# Patient Record
Sex: Male | Born: 1985 | Race: White | Hispanic: No | Marital: Single | Smoking: Never smoker
Health system: Southern US, Community
[De-identification: ages and names within clinical notes are randomized; demographics above are authoritative.]

## PROBLEM LIST (undated history)

## (undated) DIAGNOSIS — R053 Chronic cough: Secondary | ICD-10-CM

## (undated) DIAGNOSIS — R05 Cough: Secondary | ICD-10-CM

## (undated) DIAGNOSIS — R569 Unspecified convulsions: Secondary | ICD-10-CM

## (undated) DIAGNOSIS — E109 Type 1 diabetes mellitus without complications: Secondary | ICD-10-CM

## (undated) DIAGNOSIS — E10319 Type 1 diabetes mellitus with unspecified diabetic retinopathy without macular edema: Secondary | ICD-10-CM

## (undated) HISTORY — DX: Type 1 diabetes mellitus with unspecified diabetic retinopathy without macular edema: E10.319

## (undated) HISTORY — DX: Unspecified convulsions: R56.9

## (undated) HISTORY — DX: Type 1 diabetes mellitus without complications: E10.9

## (undated) HISTORY — PX: EYE SURGERY: SHX253

## (undated) HISTORY — DX: Chronic cough: R05.3

## (undated) HISTORY — DX: Cough: R05

---

## 2015-09-19 ENCOUNTER — Encounter: Payer: Self-pay | Admitting: Emergency Medicine

## 2015-09-19 ENCOUNTER — Emergency Department (INDEPENDENT_AMBULATORY_CARE_PROVIDER_SITE_OTHER): Payer: Self-pay

## 2015-09-19 ENCOUNTER — Emergency Department
Admission: EM | Admit: 2015-09-19 | Discharge: 2015-09-19 | Disposition: A | Discharge status: Home / Self Care | Payer: Self-pay | Source: Home / Self Care

## 2015-09-19 DIAGNOSIS — J209 Acute bronchitis, unspecified: Secondary | ICD-10-CM

## 2015-09-19 DIAGNOSIS — R05 Cough: Secondary | ICD-10-CM

## 2015-09-19 MED ORDER — PREDNISONE 20 MG PO TABS: 20.0000 mg | ORAL_TABLET | Freq: Two times a day (BID) | ORAL | Status: DC

## 2015-09-19 MED ORDER — AZITHROMYCIN 250 MG PO TABS: ORAL_TABLET | ORAL | Status: DC

## 2015-09-19 NOTE — ED Notes (Signed)
Pt c/o non productive cough for the last month. States it hurts to take a deep breath. Denies fever.

## 2015-09-19 NOTE — Discharge Instructions (Signed)
Take plain guaifenesin (1200mg  extended release tabs such as Mucinex) twice daily, with plenty of water, for cough and congestion.  Get adequate rest.   May take Delsym Cough Suppressant at bedtime for nighttime cough.  Stop all antihistamines for now, and other non-prescription cough/cold preparations. Monitor glucose carefully while taking prednisone.  Follow-up with family doctor if not improving about 7 to10 days.

## 2015-09-19 NOTE — ED Provider Notes (Signed)
CSN: [id number]     Arrival date & time 09/19/15  1033 History   None    Chief Complaint  Patient presents with  . Cough      HPI Comments: Patient developed a flu-like illness with cough, chills, myalgias, and sinus congestion about 6 weeks ago.  He felt better after about two weeks.  However, about 3 weeks ago his cough began to increase.  About one week ago he developed chills, increased fatigue, and myalgias.  He now complains of persistent tightness in his anterior chest and pain with cough.  He often coughs until he gags.  The history is provided by the patient.    Past Medical History  Diagnosis Date  . Diabetes mellitus without complication (HCC)    History reviewed. No pertinent past surgical history. History reviewed. No pertinent family history. Social History  Substance Use Topics  . Smoking status: Never Smoker   . Smokeless tobacco: Never Used  . Alcohol Use: No   OB History    No data available     Review of Systems No sore throat + cough No pleuritic pain, but has pain in anterior chest No wheezing No nasal congestion No post-nasal drainage No sinus pain/pressure No itchy/red eyes No earache No hemoptysis No SOB No fever/chills No nausea No vomiting No abdominal pain No diarrhea No urinary symptoms No skin rash + fatigue + myalgias No headache Used OTC meds without relief  Allergies  Review of patient's allergies indicates not on file.  Home Medications   Prior to Admission medications   Medication Sig Start Date End Date Taking? Authorizing Provider  glucose blood test strip 1 each by Other route as needed for other. Use as instructed   Yes Historical Provider, MD  azithromycin (ZITHROMAX Z-PAK) 250 MG tablet Take 2 tabs today; then begin one tab once daily for 4 more days. 09/19/15   Lattie HawStephen A Beese, MD  predniSONE (DELTASONE) 20 MG tablet Take 1 tablet (20 mg total) by mouth 2 (two) times daily. Take with food. 09/19/15   Lattie HawStephen A Beese,  MD   Meds Ordered and Administered this Visit  Medications - No data to display  BP 113/78 mmHg  Pulse 102  Temp(Src) 98.2 F (36.8 C) (Oral)  Wt 139 lb (63.05 kg)  SpO2 98% No data found.   Physical Exam Nursing notes and Vital Signs reviewed. Appearance:  Patient appears stated age, and in no acute distress Eyes:  Pupils are equal, round, and reactive to light and accomodation.  Extraocular movement is intact.  Conjunctivae are not inflamed  Ears:  Canals normal.  Tympanic membranes normal.  Nose:  Mildly congested turbinates.  No sinus tenderness.    Pharynx:  Normal; moist mucous membranes  Neck:  Supple.  Tender slightly enlarged posterior/lateral nodes are palpated bilaterally  Lungs:  Clear to auscultation.  Breath sounds are equal.  Moving air well. Heart:  Regular rate and rhythm without murmurs, rubs, or gallops.  Rate 104 Abdomen:  Nontender without masses or hepatosplenomegaly.  Bowel sounds are present.  No CVA or flank tenderness.  Extremities:  No edema.  Skin:  No rash present.   ED Course  Procedures none  Imaging Review Dg Chest 2 View  09/19/2015  CLINICAL DATA:  Cough for 6 weeks.  Initial encounter. EXAM: CHEST  2 VIEW COMPARISON:  None. FINDINGS: The lungs are clear. Heart size is normal. There is no pneumothorax or pleural effusion. No bony abnormality. IMPRESSION: Negative chest. Electronically  Signed   By: Drusilla Kannerhomas  Dalessio M.D.   On: 09/19/2015 11:52     MDM   1. Acute bronchitis, unspecified organism; ?pertussis     Begin Z-pak for atypical coverage. Begin prednisone burst.  Take plain guaifenesin (1200mg  extended release tabs such as Mucinex) twice daily, with plenty of water, for cough and congestion.  Get adequate rest.   May take Delsym Cough Suppressant at bedtime for nighttime cough.  Stop all antihistamines for now, and other non-prescription cough/cold preparations. Monitor glucose carefully while taking prednisone.  Follow-up with family  doctor if not improving about 7 to10 days.    Lattie HawStephen A Beese, MD 09/19/15 [phone]

## 2015-10-28 ENCOUNTER — Encounter: Payer: Self-pay | Admitting: *Deleted

## 2015-10-28 ENCOUNTER — Emergency Department
Admission: EM | Admit: 2015-10-28 | Discharge: 2015-10-28 | Disposition: A | Discharge status: Home / Self Care | Payer: Self-pay | Source: Home / Self Care | Attending: Family Medicine | Admitting: Family Medicine

## 2015-10-28 DIAGNOSIS — M2550 Pain in unspecified joint: Secondary | ICD-10-CM

## 2015-10-28 DIAGNOSIS — R079 Chest pain, unspecified: Secondary | ICD-10-CM

## 2015-10-28 DIAGNOSIS — R9431 Abnormal electrocardiogram [ECG] [EKG]: Secondary | ICD-10-CM

## 2015-10-28 LAB — POCT CBC W AUTO DIFF (K'VILLE URGENT CARE)

## 2015-10-28 MED ORDER — MELOXICAM 7.5 MG PO TABS: 7.5000 mg | ORAL_TABLET | Freq: Every day | ORAL | 0 refills | Status: DC

## 2015-10-28 MED ORDER — PREDNISONE 20 MG PO TABS: ORAL_TABLET | ORAL | 0 refills | Status: DC

## 2015-10-28 NOTE — ED Provider Notes (Signed)
CSN: [id number]     Arrival date & time 10/28/15  1146 History   First MD Initiated Contact with Patient 10/28/15 1232     Chief Complaint  Patient presents with  . Joint Pain   (Consider location/radiation/quality/duration/timing/severity/associated sxs/prior Treatment) HPI  Jonathan Armstrong is a 30 y.o. male presenting to UC with c/o 5 weeks of diffuse joint pain.  He states he was seen at [facility] on 09/19/15 for chest pain and dx with acute bronchitis.  He has since completed a 5 day course of prednisone and Azithromycin.  Cough and congestion have nearly resolved, however, he is still having Right sided aching sore chest pain as well as soreness in his joints.  He has been taking Excedrin with provides mild temporary relief. He tries not to take a lot of medications however does take Exedrin when the pain becomes bad.  Pain is 4/10 at this time. Mild nausea. Denies vomiting and diarrhea. Hx of DM, he has been taking his DM medication, doing well on the medication and CBGs have been good.  No recent travel or sick contacts. No rashes. He has seen a tick on his body but states it was not attached and does not believe he has been bit.  Denies hx of CAD or family hx of heart issues. He does not currently have a PCP due to lack of insurance.    Past Medical History:  Diagnosis Date  . Diabetes mellitus without complication (HCC)    History reviewed. No pertinent surgical history. History reviewed. No pertinent family history. Social History  Substance Use Topics  . Smoking status: Never Smoker  . Smokeless tobacco: Never Used  . Alcohol use No    Review of Systems  Constitutional: Negative for chills, diaphoresis, fatigue and fever.  HENT: Negative for congestion, sore throat, trouble swallowing and voice change.   Respiratory: Negative for cough, chest tightness, shortness of breath and wheezing.   Cardiovascular: Positive for chest pain (Right side). Negative for palpitations.   Gastrointestinal: Positive for nausea. Negative for abdominal pain, diarrhea and vomiting.  Musculoskeletal: Positive for arthralgias and myalgias. Negative for back pain.  Skin: Negative for color change and rash.  Neurological: Negative for dizziness, syncope, weakness, light-headedness, numbness and headaches.    Allergies  Review of patient's allergies indicates no known allergies.  Home Medications   Prior to Admission medications   Medication Sig Start Date End Date Taking? Authorizing Provider  glucose blood test strip 1 each by Other route as needed for other. Use as instructed    Historical Provider, MD  meloxicam (MOBIC) 7.5 MG tablet Take 1-2 tablets (7.5-15 mg total) by mouth daily. 10/28/15   Junius Finner, PA-C  predniSONE (DELTASONE) 20 MG tablet 3 tabs po daily x 3 days, then 2 tabs x 3 days, then 1.5 tabs x 3 days, then 1 tab x 3 days, then 0.5 tabs x 3 days 10/28/15   Junius Finner, PA-C   Meds Ordered and Administered this Visit  Medications - No data to display  BP 105/74 (BP Location: Left Arm)   Pulse 107   Temp 98.3 F (36.8 C) (Oral)   Resp 16   Ht 5\' 9"  (1.753 m)   Wt 137 lb (62.1 kg)   SpO2 99%   BMI 20.23 kg/m  No data found.   Physical Exam  Constitutional: He appears well-developed and well-nourished.  HENT:  Head: Normocephalic and atraumatic.  Eyes: Conjunctivae are normal. No scleral icterus.  Neck: Normal range  of motion.  Cardiovascular: Normal rate, regular rhythm and normal heart sounds.   Pulmonary/Chest: Effort normal and breath sounds normal. No respiratory distress. He has no wheezes. He has no rales. He exhibits tenderness ( Right side).  No respiratory distress. Lungs: CTAB. Mild tenderness to Right upper anterior chest, no crepitus. Able to speak in full sentences w/o difficulty.   Abdominal: Soft. He exhibits no distension. There is no tenderness.  Musculoskeletal: Normal range of motion.  Neurological: He is alert.  Skin: Skin is  warm and dry.  Nursing note and vitals reviewed.   Urgent Care Course   Clinical Course    Procedures (including critical care time)  Labs Review Labs Reviewed  COMPLETE METABOLIC PANEL WITH GFR  B. BURGDORFI ANTIBODIES  TSH  SEDIMENTATION RATE  C-REACTIVE PROTEIN  ROCKY MTN SPOTTED FVR ABS PNL(IGG+IGM)  POCT CBC W AUTO DIFF ([facility])    Imaging Review No results found.  Date/Time: 10/28/15    12:41:54 Ventricular Rate: 94 PR Interval: 132 QRS Duration: 88 QT Interval: 346 QTC Calculation: 405 P-QRS-T: 72/82/90 Text Interpretation: Sinus rhythm, Left atrial enlargement, nonspecific T-abnormality. Abnormal.   No prior EKG to compare.    MDM   1. Arthralgia   2. Chest pain, unspecified chest pain type   3. Abnormal EKG    Pt c/o 5 weeks of diffuse joint pain w/o swelling, rashes or fever. Pt does have mild nausea and persistent Right side chest pain that is reproducible with palpation.  Pt quested further evaluation of his heart despite prior hx of heart disease or family hx of cardiac issues. EKG abnormal, suggestive of enlarged atria.  CP atypical for ACS.  Recommended f/u outpatient cardiology for further evaluation if CP persists. Advised to call 911 or go to closest emergency department if symptoms of chest pain or SOB worsen.  Labs: CBC- unremarkable.  Labs pendings: CMP, TSH, CRP, Sed Rate, Lymes, and RMSF.  Rx: Prednisone 2 week taper and meloxicam Encouraged f/u with Dr. Denyse Amass, Primary Care/Sports Medicine, in 1 week if not improving and for ongoing healthcare needs. Patient verbalized understanding and agreement with treatment plan.      Junius Finner, PA-C 10/28/15 1310

## 2015-10-28 NOTE — Discharge Instructions (Signed)
°  You have been prescribed 2 weeks of prednisone, an oral steroid to help with inflammation and itching.  You may start this medication tomorrow with breakfast as it can make it difficult to sleep if taken at night.  Meloxicam (Mobic) is an antiinflammatory to help with pain and inflammation.  Do not take ibuprofen, Advil, Aleve, or any other medications that contain NSAIDs while taking meloxicam as this may cause stomach upset or even ulcers if taken in large amounts for an extended period of time.

## 2015-10-28 NOTE — ED Triage Notes (Signed)
Pt c/o joint pain x 5 wks. He reports being seen for acute bronchitis, CP and joint pain on 09/18/25. The Bronchitis has resolved, but he has continued joint pain. Excedrin helps minimally.

## 2015-10-29 LAB — COMPLETE METABOLIC PANEL WITH GFR
ALT: 31 U/L (ref 9–46)
AST: 22 U/L (ref 10–40)
Albumin: 4.3 g/dL (ref 3.6–5.1)
Alkaline Phosphatase: 60 U/L (ref 40–115)
BUN: 15 mg/dL (ref 7–25)
CO2: 26 mmol/L (ref 20–31)
Calcium: 9.5 mg/dL (ref 8.6–10.3)
Chloride: 98 mmol/L (ref 98–110)
Creat: 0.89 mg/dL (ref 0.60–1.35)
GFR, Est African American: >89 mL/min (ref >=60)
GFR, Est Non African American: >89 mL/min (ref >=60)
Glucose, Bld: 342 mg/dL — ABNORMAL HIGH (ref 65–99)
Potassium: 4.9 mmol/L (ref 3.5–5.3)
Sodium: 138 mmol/L (ref 135–146)
Total Bilirubin: 0.6 mg/dL (ref 0.2–1.2)
Total Protein: 7.2 g/dL (ref 6.1–8.1)

## 2015-10-29 LAB — SEDIMENTATION RATE: Sed Rate: 5 mm/hr (ref 0–15)

## 2015-10-29 LAB — TSH: TSH: 1.09 mIU/L (ref 0.40–4.50)

## 2015-10-29 LAB — C-REACTIVE PROTEIN: CRP: <0.5 mg/dL (ref <0.60)

## 2015-10-29 LAB — LYME AB/WESTERN BLOT REFLEX: B burgdorferi Ab IgG+IgM: <0.9 Index (ref <0.90)

## 2015-10-31 ENCOUNTER — Telehealth: Payer: Self-pay | Admitting: Emergency Medicine

## 2015-10-31 LAB — ROCKY MTN SPOTTED FVR ABS PNL(IGG+IGM)
RMSF IgG: NOT DETECTED
RMSF IgM: NOT DETECTED

## 2015-10-31 NOTE — Telephone Encounter (Signed)
All labs normal except glucose 342, call if you have any questions, hope you are feeling better

## 2015-11-28 ENCOUNTER — Encounter: Payer: Self-pay | Admitting: Emergency Medicine

## 2015-11-28 ENCOUNTER — Emergency Department (INDEPENDENT_AMBULATORY_CARE_PROVIDER_SITE_OTHER): Payer: Self-pay

## 2015-11-28 ENCOUNTER — Emergency Department (INDEPENDENT_AMBULATORY_CARE_PROVIDER_SITE_OTHER)
Admission: EM | Admit: 2015-11-28 | Discharge: 2015-11-28 | Disposition: A | Discharge status: Home / Self Care | Payer: Self-pay | Source: Home / Self Care | Attending: Family Medicine | Admitting: Family Medicine

## 2015-11-28 DIAGNOSIS — S134XXA Sprain of ligaments of cervical spine, initial encounter: Secondary | ICD-10-CM

## 2015-11-28 DIAGNOSIS — M542 Cervicalgia: Secondary | ICD-10-CM

## 2015-11-28 DIAGNOSIS — S139XXA Sprain of joints and ligaments of unspecified parts of neck, initial encounter: Secondary | ICD-10-CM

## 2015-11-28 MED ORDER — MELOXICAM 7.5 MG PO TABS: 7.5000 mg | ORAL_TABLET | Freq: Every day | ORAL | 0 refills | Status: DC
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
## 2015-11-28 NOTE — Discharge Instructions (Signed)
Apply ice pack for 20 to 30 minutes, 3 to 4 times daily  Continue until pain decreases.  °Begin range of motion and stretching exercises as tolerated. °
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
## 2015-11-30 ENCOUNTER — Emergency Department (INDEPENDENT_AMBULATORY_CARE_PROVIDER_SITE_OTHER)
Admission: EM | Admit: 2015-11-30 | Discharge: 2015-11-30 | Disposition: A | Discharge status: Home / Self Care | Payer: Self-pay | Source: Home / Self Care | Attending: Family Medicine | Admitting: Family Medicine

## 2015-11-30 ENCOUNTER — Encounter: Payer: Self-pay | Admitting: Emergency Medicine

## 2015-11-30 ENCOUNTER — Emergency Department (INDEPENDENT_AMBULATORY_CARE_PROVIDER_SITE_OTHER): Payer: Self-pay

## 2015-11-30 DIAGNOSIS — R0781 Pleurodynia: Secondary | ICD-10-CM

## 2015-11-30 DIAGNOSIS — S20212A Contusion of left front wall of thorax, initial encounter: Secondary | ICD-10-CM

## 2015-11-30 MED ORDER — HYDROCODONE-ACETAMINOPHEN 5-325 MG PO TABS: ORAL_TABLET | ORAL | 0 refills | Status: DC
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
## 2015-11-30 NOTE — Discharge Instructions (Signed)
Wear rib belt until improved.  Continue to apply ice pack 2 or 3 times daily.  Continue Mobic
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
## 2016-01-13 ENCOUNTER — Ambulatory Visit (INDEPENDENT_AMBULATORY_CARE_PROVIDER_SITE_OTHER): Payer: Self-pay | Admitting: Sports Medicine

## 2016-01-13 ENCOUNTER — Encounter: Payer: Self-pay | Admitting: Sports Medicine

## 2016-01-13 ENCOUNTER — Ambulatory Visit (INDEPENDENT_AMBULATORY_CARE_PROVIDER_SITE_OTHER): Payer: Self-pay

## 2016-01-13 DIAGNOSIS — R0789 Other chest pain: Secondary | ICD-10-CM

## 2016-01-13 DIAGNOSIS — R911 Solitary pulmonary nodule: Secondary | ICD-10-CM

## 2016-01-13 MED ORDER — DICLOFENAC SODIUM 75 MG PO TBEC: 75.0000 mg | DELAYED_RELEASE_TABLET | Freq: Two times a day (BID) | ORAL | 3 refills | Status: DC

## 2016-01-13 NOTE — Assessment & Plan Note (Signed)
Persistent for greater than 6 weeks despite conservative measures. X-rays were negative. Pain is along the costal margin. We are going to get a CT of the chest without contrast for further the limitation of his anatomy. Discontinue meloxicam and switching to Voltaren.
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
## 2016-04-02 ENCOUNTER — Ambulatory Visit (INDEPENDENT_AMBULATORY_CARE_PROVIDER_SITE_OTHER): Payer: Self-pay | Admitting: Physician Assistant

## 2016-04-02 ENCOUNTER — Ambulatory Visit (INDEPENDENT_AMBULATORY_CARE_PROVIDER_SITE_OTHER): Payer: Self-pay

## 2016-04-02 ENCOUNTER — Encounter: Payer: Self-pay | Admitting: Physician Assistant

## 2016-04-02 VITALS — BP 123/82 | HR 99 | Wt 144.0 lb

## 2016-04-02 DIAGNOSIS — Z23 Encounter for immunization: Secondary | ICD-10-CM

## 2016-04-02 DIAGNOSIS — R05 Cough: Secondary | ICD-10-CM

## 2016-04-02 DIAGNOSIS — E1042 Type 1 diabetes mellitus with diabetic polyneuropathy: Secondary | ICD-10-CM | POA: Insufficient documentation

## 2016-04-02 DIAGNOSIS — IMO0002 Reserved for concepts with insufficient information to code with codable children: Secondary | ICD-10-CM | POA: Insufficient documentation

## 2016-04-02 DIAGNOSIS — E1065 Type 1 diabetes mellitus with hyperglycemia: Secondary | ICD-10-CM

## 2016-04-02 DIAGNOSIS — E108 Type 1 diabetes mellitus with unspecified complications: Secondary | ICD-10-CM

## 2016-04-02 DIAGNOSIS — R053 Chronic cough: Secondary | ICD-10-CM | POA: Insufficient documentation

## 2016-04-02 LAB — POCT GLYCOSYLATED HEMOGLOBIN (HGB A1C): HEMOGLOBIN A1C: 8.1

## 2016-04-02 MED ORDER — ALBUTEROL SULFATE HFA 108 (90 BASE) MCG/ACT IN AERS
1.0000 | INHALATION_SPRAY | RESPIRATORY_TRACT | 0 refills | Status: DC | PRN
Start: 2016-04-02 — End: 2017-02-10

## 2016-04-02 MED ORDER — GABAPENTIN 100 MG PO CAPS: 100.0000 mg | ORAL_CAPSULE | Freq: Every day | ORAL | 11 refills | Status: DC

## 2016-04-02 NOTE — Progress Notes (Signed)
HPI:                                                                Jonathan Armstrong is a 31 y.o. male who presents to [facility]: [facility] Medicine today to establish care   He is accompanied by his mother, who provides most of the history. Mother states patient was living in [place], but was unable to take care of himself. He is currently unemployed and living with her.  DM Type I: diagnosed age 49. Checks BS in the am and pm, sometimes lunch. Sliding scale of Novolin, 60 total units per day. Morning sugars are high, 250 on average. No recent hospitalizations. Does have retinopathy for which he is seeing ophthalmology. Also endorses numbness in his toes. He is checking his feet.  Cough: this is a chronic problem for him. worsening cough x 2 months. Cough is rarely productive of mucus. Coughs so hard it makes him regurgitate food. Nonsmoker. No history of asthma or COPD.   Health Maintenance Health Maintenance  Topic Date Due  . HEMOGLOBIN A1C  1983/08/15  . PNEUMOCOCCAL POLYSACCHARIDE VACCINE (1) 03/10/1986  . FOOT EXAM  03/10/1994  . OPHTHALMOLOGY EXAM  03/10/1994  . HIV Screening  03/11/1999  . TETANUS/TDAP  03/11/2003  . INFLUENZA VACCINE  10/28/2015    Past Medical History:  Diagnosis Date  . Diabetes mellitus without complication (HCC)    No past surgical history on file. Social History  Substance Use Topics  . Smoking status: Never Smoker  . Smokeless tobacco: Never Used  . Alcohol use No   family history is not on file.  Review of Systems  Constitutional: Negative for chills, fever, malaise/fatigue and weight loss.  HENT: Negative.   Eyes: Positive for blurred vision (wears corrective lenses).  Respiratory: Positive for cough. Negative for hemoptysis, sputum production and shortness of breath.   Cardiovascular: Negative.   Gastrointestinal: Negative.   Genitourinary: Negative.   Musculoskeletal: Positive for back pain.  Skin:  Negative.   Neurological: Positive for tingling (b/l feet) and sensory change (b/l feet). Negative for focal weakness.     Medications: Current Outpatient Prescriptions  Medication Sig Dispense Refill  . albuterol (PROVENTIL HFA;VENTOLIN HFA) 108 (90 Base) MCG/ACT inhaler Inhale 1 puff into the lungs every 4 (four) hours as needed for wheezing. 1 Inhaler 0  . gabapentin (NEURONTIN) 100 MG capsule Take 1 capsule (100 mg total) by mouth at bedtime. 60 capsule 11  . glucose blood test strip 1 each by Other route as needed for other. Use as instructed     No current facility-administered medications for this visit.    No Known Allergies     Objective:  BP 123/82   Pulse 99   Wt 144 lb (65.3 kg)   BMI 21.27 kg/m  Gen: well-groomed, cooperative, not ill-appearing, no distress HEENT: normal conjunctiva, TM's clear, oropharynx clear, moist mucus membranes, no thyromegaly or tenderness Lungs: Normal work of breathing, clear to auscultation bilaterally Heart: Normal rate, regular rhythm, s1 and s2 distinct, no murmurs, clicks or rubs appreciated on this exam, no carotid bruit Abd: Soft. Nondistended, Nontender Neuro: alert and oriented x 3, EOM's intact, PERRLA, DTR's intact MSK: strength 5/5 and symmetric, normal gait Extremities: distal pulses intact,  no peripheral edema Skin: warm and dry, no rashes or lesions on exposed skin Psych: flat affect, pleasant mood, slowed speech  Diabetic Foot Exam - Simple   Simple Foot Form Diabetic Foot exam was performed with the following findings:  Yes 04/02/2016  4:21 PM  Visual Inspection No deformities, no ulcerations, no other skin breakdown bilaterally:  Yes Sensation Testing Pulse Check Comments No sensation in left foot with monofilament testing Sensory deficit in the right great toe with monofilament     Assessment and Plan: 31 y.o. male with   Patient declined labs due to cost and uninsured status  Chronic cough - DG Chest 2  View - albuterol (PROVENTIL HFA;VENTOLIN HFA) 108 (90 Base) MCG/ACT inhaler; Inhale 1 puff into the lungs every 4 (four) hours as needed for wheezing.  Dispense: 1 Inhaler; Refill: 0  Type 1 diabetes mellitus with complication, uncontrolled (HCC) - A1C 8.1 today - Pneumovax and influenza vaccines given today - referred to Endocrinology. I told [facility] to [facility] to see if they had any financial assistance   Diabetic polyneuropathy [facility](HCC) - follow-up with Ophthalmology for retinopathy - Ambulatory referral to Endocrinology - gabapentin (NEURONTIN) 100 MG capsule; Take 1 capsule (100 mg total) by mouth at bedtime.  Dispense: 60 capsule; Refill: 11  Patient education and anticipatory guidance given Patient agrees with treatment plan Follow-up in 6 months or sooner as needed  Levonne Hubertharley E. Jazleen Robeck PA-C

## 2016-04-02 NOTE — Patient Instructions (Signed)
I have ordered a chest xray. Please go downstairs and complete that today. We will contact you with the results I have sent a prescription to your pharmacy for an inhaler for bronchospasm/cough I have sent a referral to Endocrinology. I believe [facility], so I recommend you contact them about that.
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
## 2017-02-10 ENCOUNTER — Ambulatory Visit (INDEPENDENT_AMBULATORY_CARE_PROVIDER_SITE_OTHER): Payer: Self-pay | Admitting: Physician Assistant

## 2017-02-10 VITALS — BP 122/76 | HR 87

## 2017-02-10 DIAGNOSIS — Z1331 Encounter for screening for depression: Secondary | ICD-10-CM

## 2017-02-10 DIAGNOSIS — Z23 Encounter for immunization: Secondary | ICD-10-CM

## 2017-02-10 NOTE — Progress Notes (Signed)
Spoke with PCP regarding positive depression screen, OV appt made.

## 2017-02-14 ENCOUNTER — Ambulatory Visit (INDEPENDENT_AMBULATORY_CARE_PROVIDER_SITE_OTHER): Payer: Self-pay

## 2017-02-14 ENCOUNTER — Encounter: Payer: Self-pay | Admitting: Physician Assistant

## 2017-02-14 ENCOUNTER — Ambulatory Visit (INDEPENDENT_AMBULATORY_CARE_PROVIDER_SITE_OTHER): Payer: Self-pay | Admitting: Physician Assistant

## 2017-02-14 VITALS — BP 138/90 | HR 90 | Temp 97.9°F | Wt 151.0 lb

## 2017-02-14 DIAGNOSIS — E10319 Type 1 diabetes mellitus with unspecified diabetic retinopathy without macular edema: Secondary | ICD-10-CM

## 2017-02-14 DIAGNOSIS — R05 Cough: Secondary | ICD-10-CM

## 2017-02-14 DIAGNOSIS — R053 Chronic cough: Secondary | ICD-10-CM

## 2017-02-14 DIAGNOSIS — Z87898 Personal history of other specified conditions: Secondary | ICD-10-CM

## 2017-02-14 DIAGNOSIS — E1065 Type 1 diabetes mellitus with hyperglycemia: Secondary | ICD-10-CM

## 2017-02-14 DIAGNOSIS — E108 Type 1 diabetes mellitus with unspecified complications: Secondary | ICD-10-CM

## 2017-02-14 DIAGNOSIS — IMO0002 Reserved for concepts with insufficient information to code with codable children: Secondary | ICD-10-CM

## 2017-02-14 HISTORY — DX: Type 1 diabetes mellitus with unspecified diabetic retinopathy without macular edema: E10.319

## 2017-02-14 LAB — POCT GLYCOSYLATED HEMOGLOBIN (HGB A1C): HEMOGLOBIN A1C: 8

## 2017-02-14 LAB — POCT UA - MICROALBUMIN
Albumin/Creatinine Ratio, Urine, POC: <30
CREATININE, POC: 200 mg/dL
Microalbumin Ur, POC: 10 mg/L

## 2017-02-14 MED ORDER — OMEPRAZOLE 20 MG PO CPDR: 20.0000 mg | DELAYED_RELEASE_CAPSULE | Freq: Every day | ORAL | 11 refills | Status: DC

## 2017-02-14 MED ORDER — MONTELUKAST SODIUM 10 MG PO TABS: 10.0000 mg | ORAL_TABLET | Freq: Every day | ORAL | 3 refills | Status: DC

## 2017-02-14 MED ORDER — CETIRIZINE HCL 10 MG PO TABS: 10.0000 mg | ORAL_TABLET | Freq: Every day | ORAL | 11 refills | Status: DC

## 2017-02-14 NOTE — Progress Notes (Signed)
HPI:                                                                Jonathan Armstrong is a 31 y.o. male who [facility] today for cough / diabetes follow-up  Patient with uncontrolled Type I DM  Patient reports persistent non-productive cough for 1.5 years. Reports cough is aggravated by eating. Endorses nightsweats and dyspnea on exertion. Denies fever, chills, weight loss, wheezing, hemoptysis. Denies occupational exposure or smoking history. No international travel. No sick contacts. Has tried Albuterol inhaler, OTC syrups and cough drops He had a CT chest in October 2017, which showed a 4 mm ground glass nodule in the LUL that did not require follow-up imaging. Chest x-ray in January 2018 was unremarkable.  DMII: patient was referred to endocrinology, but never established care. He is managing his own Insulin by [facility]ials [facility]. Reports he injects 15 units of Novolin N twice a day and approximately 15 units of R at mealtime. Compliant with medications. Last A1C 8.1 (10 months ago). Checks blood sugars at home. Blood sugar range 120-185. Denies polyuria, vision change, and paresthesias. Denies ulcers/wounds on feet. Reports a severe hypoglycemic event in August 2018 where he had a seizure. Reports he did not seek medical attention because he does not have health insurance.  Eye exam: UTD Foot exam: UTD Diet: reports eating about 70g of carbohydrates per meal   Past Medical History:  Diagnosis Date  . Diabetes mellitus without complication (HCC)   . Diabetic retinopathy associated with type 1 diabetes mellitus (HCC) 02/14/2017  . Seizure [facility](HCC)    Past Surgical History:  Procedure Laterality Date  . EYE SURGERY     Social History   Tobacco Use  . Smoking status: Never Smoker  . Smokeless tobacco: Never Used  Substance Use Topics  . Alcohol use: No   family history is  not on file.  ROS: negative except as noted in the HPI  Medications: Current Outpatient Medications  Medication Sig Dispense Refill  . glucose blood test strip 1 each by Other route as needed for other. Use as instructed    . insulin NPH Human (HUMULIN N,NOVOLIN N) 100 UNIT/ML injection Inject 15 Units 2 (two) times daily into the skin.    Marland Kitchen. insulin regular (NOVOLIN R,HUMULIN R) 100 units/mL injection Inject 15 Units 2 (two) times daily before a meal into the skin.     . cetirizine (ZYRTEC) 10 MG tablet Take 1 tablet (10 mg total) daily by mouth. 30 tablet 11  . montelukast (SINGULAIR) 10 MG tablet Take 1 tablet (10 mg total) at bedtime by mouth. 30 tablet 3  . omeprazole (PRILOSEC) 20 MG capsule Take 1 capsule (20 mg total) daily by mouth. 30 capsule 11   No current facility-administered medications for this visit.    No Known Allergies     Objective:  BP 138/90   Pulse 90   Temp 97.9 F (36.6 C)   Wt 151 lb (68.5 kg)   SpO2 99%   BMI 22.30 kg/m  Gen:  alert, not ill-appearing, no distress, appropriate for age HEENT: head normocephalic without obvious abnormality, conjunctiva and cornea clear, wearing  glasses, oropharynx clear, neck supple, no adenopathy, trachea midline Pulm: Normal work of breathing, normal phonation, clear to auscultation bilaterally, no wheezes, rales or rhonchi CV: Normal rate, regular rhythm, s1 and s2 distinct, no murmurs, clicks or rubs  GI: abdomen normal appearing, soft, nontender, nondistended, negative Murphy's sign, no palpable masses, no organomegaly Neuro: alert and oriented x 3, no tremor MSK: extremities atraumatic, normal gait and station Skin: intact, no rashes on exposed skin, no jaundice, no cyanosis  Depression screen PHQ 2/9 02/10/2017  Decreased Interest 2  Down, Depressed, Hopeless 1  PHQ - 2 Score 3  Altered sleeping 3  Tired, decreased energy 2  Change in appetite 0  Feeling bad or failure about yourself  1  Trouble  concentrating 2  Moving slowly or fidgety/restless 0  Suicidal thoughts 0  PHQ-9 Score 11     No results found for this or any previous visit (from the past 72 hour(s)). No results found.    Assessment and Plan: 31 y.o. male with   1. Persistent cough - personally reviewed Chest x-ray today, which was negative for cardiopulmonary disease. Recommend patient undergo follow-up spirometry. Differential includes upper airway cough syndrome versus GERD versus asthma/pulmonary disease. Trial of nightly antihistamine, leukotriene antagonist, and PPI for 4 weeks. - DG Chest 2 View; Future - omeprazole (PRILOSEC) 20 MG capsule; Take 1 capsule (20 mg total) daily by mouth.  Dispense: 30 capsule; Refill: 11 - cetirizine (ZYRTEC) 10 MG tablet; Take 1 tablet (10 mg total) daily by mouth.  Dispense: 30 tablet; Refill: 11 - montelukast (SINGULAIR) 10 MG tablet; Take 1 tablet (10 mg total) at bedtime by mouth.  Dispense: 30 tablet; Refill: 3  2. Type 1 diabetes mellitus with complication, uncontrolled (HCC) - POCT HgB A1C 8.0 today, not at goal - POCT UA - Microalbumin normal - recommended patient titrate his basal insulin by 2 units every 3 days until at morning FBG goal of 80-130. Also counseled to reduce carbohydrate intake at meals - follow-up in 3 months - CBC with Differential/Platelet - Comprehensive metabolic panel - Lipid Panel w/reflex Direct LDL   3. 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 Cummings PA-C

## 2017-02-14 NOTE — Patient Instructions (Addendum)
I have also ordered fasting labs. The lab is a walk-in open M-F 7:30a-4:30p (closed 12:30-1:30p). Nothing to eat or drink after midnight or at least 8 hours before your blood draw. You can have water and your medications.   For your diabetes: - continue 15 units of basal insulin twice a day - check your morning fasting blood sugar daily (goal 80 - 130 fasting) - increase your basal insulin by 2 units every 3 days until you are at goal - try to keep your carbohydrate intake consistent at mealtimes - 30 g of carbs for meals, 15 g of carbs for snacks. Use 1 unit of insulin per 8 grams of carbs (4 units per 30g meal) - follow-up in 3 months    Carbohydrate Counting for Diabetes Mellitus, Adult Carbohydrate counting is a method for keeping track of how many carbohydrates you eat. Eating carbohydrates naturally increases the amount of sugar (glucose) in the blood. Counting how many carbohydrates you eat helps keep your blood glucose within normal limits, which helps you manage your diabetes (diabetes mellitus). It is important to know how many carbohydrates you can safely have in each meal. This is different for every person. A diet and nutrition specialist (registered dietitian) can help you make a meal plan and calculate how many carbohydrates you should have at each meal and snack. Carbohydrates are found in the following foods:  Grains, such as breads and cereals.  Dried beans and soy products.  Starchy vegetables, such as potatoes, peas, and corn.  Fruit and fruit juices.  Milk and yogurt.  Sweets and snack foods, such as cake, cookies, candy, chips, and soft drinks.  How do I count carbohydrates? There are two ways to count carbohydrates in food. You can use either of the methods or a combination of both. Reading "Nutrition Facts" on packaged food The "Nutrition Facts" list is included on the labels of almost all packaged foods and beverages in the [place] It includes:  The serving  size.  Information about nutrients in each serving, including the grams (g) of carbohydrate per serving.  To use the "Nutrition Facts":  Decide how many servings you will have.  Multiply the number of servings by the number of carbohydrates per serving.  The resulting number is the total amount of carbohydrates that you will be having.  Learning standard serving sizes of other foods When you eat foods containing carbohydrates that are not packaged or do not include "Nutrition Facts" on the label, you need to measure the servings in order to count the amount of carbohydrates:  Measure the foods that you will eat with a food scale or measuring cup, if needed.  Decide how many standard-size servings you will eat.  Multiply the number of servings by 15. Most carbohydrate-rich foods have about 15 g of carbohydrates per serving. ? For example, if you eat 8 oz (170 g) of strawberries, you will have eaten 2 servings and 30 g of carbohydrates (2 servings x 15 g = 30 g).  For foods that have more than one food mixed, such as soups and casseroles, you must count the carbohydrates in each food that is included.  The following list contains standard serving sizes of common carbohydrate-rich foods. Each of these servings has about 15 g of carbohydrates:   hamburger bun or  English muffin.   oz (15 mL) syrup.   oz (14 g) jelly.  1 slice of bread.  1 six-inch tortilla.  3 oz (85 g) cooked rice  or pasta.  4 oz (113 g) cooked dried beans.  4 oz (113 g) starchy vegetable, such as peas, corn, or potatoes.  4 oz (113 g) hot cereal.  4 oz (113 g) mashed potatoes or  of a large baked potato.  4 oz (113 g) canned or frozen fruit.  4 oz (120 mL) fruit juice.  4-6 crackers.  6 chicken nuggets.  6 oz (170 g) unsweetened dry cereal.  6 oz (170 g) plain fat-free yogurt or yogurt sweetened with artificial sweeteners.  8 oz (240 mL) milk.  8 oz (170 g) fresh fruit or one small piece  of fruit.  24 oz (680 g) popped popcorn.  Example of carbohydrate counting Sample meal  3 oz (85 g) chicken breast.  6 oz (170 g) brown rice.  4 oz (113 g) corn.  8 oz (240 mL) milk.  8 oz (170 g) strawberries with sugar-free whipped topping. Carbohydrate calculation 1. Identify the foods that contain carbohydrates: ? Rice. ? Corn. ? Milk. ? Strawberries. 2. Calculate how many servings you have of each food: ? 2 servings rice. ? 1 serving corn. ? 1 serving milk. ? 1 serving strawberries. 3. Multiply each number of servings by 15 g: ? 2 servings rice x 15 g = 30 g. ? 1 serving corn x 15 g = 15 g. ? 1 serving milk x 15 g = 15 g. ? 1 serving strawberries x 15 g = 15 g. 4. Add together all of the amounts to find the total grams of carbohydrates eaten: ? 30 g + 15 g + 15 g + 15 g = 75 g of carbohydrates total. This information is not intended to replace advice given to you by your health care provider. Make sure you discuss any questions you have with your health care provider. Document Released: 03/15/2005 Document Revised: 10/03/2015 Document Reviewed: 08/27/2015 Elsevier Interactive Patient Education  [employer]2018 Elsevier Inc.

## 2017-02-19 ENCOUNTER — Encounter: Payer: Self-pay | Admitting: Physician Assistant

## 2017-02-19 DIAGNOSIS — Z87898 Personal history of other specified conditions: Secondary | ICD-10-CM | POA: Insufficient documentation

## 2017-04-11 ENCOUNTER — Ambulatory Visit (INDEPENDENT_AMBULATORY_CARE_PROVIDER_SITE_OTHER): Payer: Self-pay | Admitting: Physician Assistant

## 2017-04-11 VITALS — BP 131/77 | HR 96 | Temp 98.6°F | Resp 16 | Ht 69.0 in | Wt 149.0 lb

## 2017-04-11 DIAGNOSIS — R058 Other specified cough: Secondary | ICD-10-CM | POA: Insufficient documentation

## 2017-04-11 DIAGNOSIS — R05 Cough: Secondary | ICD-10-CM

## 2017-04-11 DIAGNOSIS — R053 Chronic cough: Secondary | ICD-10-CM

## 2017-04-11 MED ORDER — ALBUTEROL SULFATE (2.5 MG/3ML) 0.083% IN NEBU
2.5000 mg | INHALATION_SOLUTION | Freq: Once | RESPIRATORY_TRACT | Status: AC
  Administered 2017-04-11: 2.5 mg via RESPIRATORY_TRACT

## 2017-04-11 MED ORDER — FLUTICASONE PROPIONATE 50 MCG/ACT NA SUSP: 1.0000 | Freq: Every day | NASAL | 1 refills | Status: AC

## 2017-04-11 MED ORDER — OMEPRAZOLE 40 MG PO CPDR: 40.0000 mg | DELAYED_RELEASE_CAPSULE | Freq: Every day | ORAL | 0 refills | Status: DC

## 2017-04-11 MED ORDER — ALBUTEROL SULFATE (2.5 MG/3ML) 0.083% IN NEBU: 2.5000 mg | INHALATION_SOLUTION | Freq: Four times a day (QID) | RESPIRATORY_TRACT | 1 refills | Status: DC | PRN

## 2017-04-11 NOTE — Progress Notes (Signed)
HPI:                                                                Jonathan Armstrong is a 32 y.o. male who presents to [facility]: Primary Care Sports Medicine today for spirometry / evaluation of chronic cough  Pleasant 32 yo M with PMH of type 1 diabetes and chronic cough presents today for spirometry. Patient has reported a non-productive persistent cough for 1.5 years. Cough is worse with exertion, lying flat, and at night. Endorses occasional rhinorrhea/nasal congestion. Denies constitutional symptoms, dyspnea, wheezing. Denies dysphagia, odynophagia, hoarseness. No history of asthma or tobacco use. States beta agonist inhaler did not help at all. Has been taking Zyrtec, Montelukast and Omeprazole without much improvement. Using cough drops daily.  Past Medical History:  Diagnosis Date  . Chronic cough   . Diabetes mellitus without complication (HCC)   . Diabetic retinopathy associated with type 1 diabetes mellitus (HCC) 02/14/2017  . Seizure [facility])    Past Surgical History:  Procedure Laterality Date  . EYE SURGERY     Social History   Tobacco Use  . Smoking status: Never Smoker  . Smokeless tobacco: Never Used  Substance Use Topics  . Alcohol use: No   family history is not on file.  Depression screen PHQ 2/9 02/10/2017  Decreased Interest 2  Down, Depressed, Hopeless 1  PHQ - 2 Score 3  Altered sleeping 3  Tired, decreased energy 2  Change in appetite 0  Feeling bad or failure about yourself  1  Trouble concentrating 2  Moving slowly or fidgety/restless 0  Suicidal thoughts 0  PHQ-9 Score 11    No flowsheet data found.    ROS: negative except as noted in the HPI  Medications: Current Outpatient Medications  Medication Sig Dispense Refill  . albuterol (PROVENTIL) (2.5 MG/3ML) 0.083% nebulizer solution Take 3 mLs (2.5 mg total) by nebulization every 6 (six) hours as needed for wheezing or shortness of breath. 150 mL 1  . cetirizine (ZYRTEC)  10 MG tablet Take 1 tablet (10 mg total) daily by mouth. 30 tablet 11  . fluticasone (FLONASE) 50 MCG/ACT nasal spray Place 1 spray into both nostrils at bedtime. 16 g 1  . glucose blood test strip 1 each by Other route as needed for other. Use as instructed    . insulin NPH Human (HUMULIN N,NOVOLIN N) 100 UNIT/ML injection Inject 15 Units 2 (two) times daily into the skin.    Marland Kitchen insulin regular (NOVOLIN R,HUMULIN R) 100 units/mL injection Inject 15 Units 2 (two) times daily before a meal into the skin.     . montelukast (SINGULAIR) 10 MG tablet Take 1 tablet (10 mg total) at bedtime by mouth. 30 tablet 3  . omeprazole (PRILOSEC) 40 MG capsule Take 1 capsule (40 mg total) by mouth daily. 30 capsule 0   No current facility-administered medications for this visit.    No Known Allergies     Objective:  BP 131/77 (BP Location: Right Arm, Patient Position: Sitting, Cuff Size: Normal)   Pulse 96   Temp 98.6 F (37 C) (Oral)   Resp 16   Ht 5\' 9"  (1.753 m)   Wt 149 lb (67.6 kg)   SpO2 98%   BMI 22.00  kg/m  Gen:  alert, not ill-appearing, no distress, appropriate for age HEENT: head normocephalic without obvious abnormality, conjunctiva and cornea clear, wearing glasses, oropharynx clear without post-nasal secretions, trachea midline Pulm: Normal work of breathing, normal phonation, clear to auscultation bilaterally, no wheezes, rales or rhonchi CV: Normal rate, regular rhythm, s1 and s2 distinct, no murmurs, clicks or rubs  Neuro: alert and oriented x 3, no tremor MSK: extremities atraumatic, normal gait and station Skin: intact, no rashes on exposed skin, no jaundice, no cyanosis Psych: well-groomed, cooperative, good eye contact, euthymic mood, affect mood-congruent, speech is articulate, and thought processes clear and goal-directed    No results found for this or any previous visit (from the past 72 hour(s)). No results found.  Office Spirometry Results: Peak Flow: 543  L/min FEV1: 3.59 liters FVC: 4.61 liters FEV1/FVC: 77.9 % FVC  % Predicted: 88 % FEV % Predicted: 84 % FeF 25-75: 3.11 liters FeF 25-75 % Predicted: 74   Assessment and Plan: 32 y.o. male with   1. Upper airway cough syndrome - normal office spirometry today - continue antihistamine and leukotriene antagonist - adding intranasal steroid - increasing omeprazole for 1 month - patient will send a MyChart message in 1 month to assess response to plan. If no improvement, referral to allergist - fluticasone (FLONASE) 50 MCG/ACT nasal spray; Place 1 spray into both nostrils at bedtime.  Dispense: 16 g; Refill: 1 - omeprazole (PRILOSEC) 40 MG capsule; Take 1 capsule (40 mg total) by mouth daily.  Dispense: 30 capsule; Refill: 0  2. Chronic cough - Spirometry with graph; Future - albuterol (PROVENTIL) (2.5 MG/3ML) 0.083% nebulizer solution 2.5 mg   Patient education and anticipatory guidance given Patient agrees with treatment plan Follow-up in 4 weeks or sooner as needed if symptoms worsen or fail to improve  Levonne Hubertharley E. Tavarus Poteete PA-C

## 2017-04-11 NOTE — Progress Notes (Signed)
   Subjective:    Patient ID: Jonathan Armstrong, male    DOB: [birth year], 32 y.o.   MRN: [id number]  HPI Patient here for spirometry. States cough continues; non-productive; no fever; inhaler did not improve it; this has been going on for more than 18 months.   Review of Systems     Objective:   Physical Exam        Assessment & Plan:

## 2017-04-11 NOTE — Patient Instructions (Addendum)
Increase your Omeprazole to 40 mg daily Start nasal steroid 1 spray nightly Continue your Zyrtec and Montelukast Invest in a netty pot or saline spray Send me a Wellsite geologistMyChart Message in 4 weeks to let me know how you are responding     Food Choices for Gastroesophageal Reflux Disease, Adult When you have gastroesophageal reflux disease (GERD), the foods you eat and your eating habits are very important. Choosing the right foods can help ease your discomfort. What guidelines do I need to follow?  Choose fruits, vegetables, whole grains, and low-fat dairy products.  Choose low-fat meat, fish, and poultry.  Limit fats such as oils, salad dressings, butter, nuts, and avocado.  Keep a food diary. This helps you identify foods that cause symptoms.  Avoid foods that cause symptoms. These may be different for everyone.  Eat small meals often instead of 3 large meals a day.  Eat your meals slowly, in a place where you are relaxed.  Limit fried foods.  Cook foods using methods other than frying.  Avoid drinking alcohol.  Avoid drinking large amounts of liquids with your meals.  Avoid bending over or lying down until 2-3 hours after eating. What foods are not recommended? These are some foods and drinks that may make your symptoms worse: Vegetables Tomatoes. Tomato juice. Tomato and spaghetti sauce. Chili peppers. Onion and garlic. Horseradish. Fruits Oranges, grapefruit, and lemon (fruit and juice). Meats High-fat meats, fish, and poultry. This includes hot dogs, ribs, ham, sausage, salami, and bacon. Dairy Whole milk and chocolate milk. Sour cream. Cream. Butter. Ice cream. Cream cheese. Drinks Coffee and tea. Bubbly (carbonated) drinks or energy drinks. Condiments Hot sauce. Barbecue sauce. Sweets/Desserts Chocolate and cocoa. Donuts. Peppermint and spearmint. Fats and Oils High-fat foods. This includes [place] fries and potato chips. Other Vinegar. Strong spices. This includes  black pepper, white pepper, red pepper, cayenne, curry powder, cloves, ginger, and chili powder. The items listed above may not be a complete list of foods and drinks to avoid. Contact your dietitian for more information. This information is not intended to replace advice given to you by your health care provider. Make sure you discuss any questions you have with your health care provider. Document Released: 09/14/2011 Document Revised: 08/21/2015 Document Reviewed: 01/17/2013 Elsevier Interactive Patient Education  2017 [employer]

## 2018-07-13 ENCOUNTER — Other Ambulatory Visit: Payer: Self-pay

## 2018-07-13 ENCOUNTER — Ambulatory Visit (INDEPENDENT_AMBULATORY_CARE_PROVIDER_SITE_OTHER): Payer: Self-pay

## 2018-07-13 ENCOUNTER — Encounter: Payer: Self-pay | Admitting: Physician Assistant

## 2018-07-13 ENCOUNTER — Ambulatory Visit (INDEPENDENT_AMBULATORY_CARE_PROVIDER_SITE_OTHER): Payer: Self-pay | Admitting: Physician Assistant

## 2018-07-13 VITALS — BP 126/80 | HR 95 | Temp 98.5°F | Wt 154.0 lb

## 2018-07-13 DIAGNOSIS — L98499 Non-pressure chronic ulcer of skin of other sites with unspecified severity: Secondary | ICD-10-CM

## 2018-07-13 DIAGNOSIS — IMO0002 Reserved for concepts with insufficient information to code with codable children: Secondary | ICD-10-CM

## 2018-07-13 DIAGNOSIS — E1065 Type 1 diabetes mellitus with hyperglycemia: Secondary | ICD-10-CM

## 2018-07-13 DIAGNOSIS — L03115 Cellulitis of right lower limb: Secondary | ICD-10-CM

## 2018-07-13 DIAGNOSIS — E10622 Type 1 diabetes mellitus with other skin ulcer: Secondary | ICD-10-CM

## 2018-07-13 DIAGNOSIS — E108 Type 1 diabetes mellitus with unspecified complications: Secondary | ICD-10-CM

## 2018-07-13 DIAGNOSIS — E1042 Type 1 diabetes mellitus with diabetic polyneuropathy: Secondary | ICD-10-CM

## 2018-07-13 LAB — CBC WITH DIFFERENTIAL/PLATELET
Absolute Monocytes: 785 cells/uL (ref 200–950)
Basophils Absolute: 41 cells/uL (ref 0–200)
Basophils Relative: 0.7 %
Eosinophils Absolute: 118 cells/uL (ref 15–500)
Eosinophils Relative: 2 %
HCT: 38.6 % (ref 38.5–50.0)
Hemoglobin: 13.2 g/dL (ref 13.2–17.1)
Lymphs Abs: 1835 cells/uL (ref 850–3900)
MCH: 29.3 pg (ref 27.0–33.0)
MCHC: 34.2 g/dL (ref 32.0–36.0)
MCV: 85.6 fL (ref 80.0–100.0)
MPV: 10.6 fL (ref 7.5–12.5)
Monocytes Relative: 13.3 %
Neutro Abs: 3121 cells/uL (ref 1500–7800)
Neutrophils Relative %: 52.9 %
Platelets: 324 10*3/uL (ref 140–400)
RBC: 4.51 10*6/uL (ref 4.20–5.80)
RDW: 12.2 % (ref 11.0–15.0)
Total Lymphocyte: 31.1 %
WBC: 5.9 10*3/uL (ref 3.8–10.8)

## 2018-07-13 LAB — COMPLETE METABOLIC PANEL WITH GFR
AG Ratio: 1.2 (calc) (ref 1.0–2.5)
ALT: 16 U/L (ref 9–46)
AST: 14 U/L (ref 10–40)
Albumin: 4 g/dL (ref 3.6–5.1)
Alkaline phosphatase (APISO): 74 U/L (ref 36–130)
BUN: 14 mg/dL (ref 7–25)
CO2: 33 mmol/L — ABNORMAL HIGH (ref 20–32)
Calcium: 9.4 mg/dL (ref 8.6–10.3)
Chloride: 100 mmol/L (ref 98–110)
Creat: 0.84 mg/dL (ref 0.60–1.35)
GFR, Est African American: 132 mL/min/{1.73_m2} (ref >=60)
GFR, Est Non African American: 114 mL/min/{1.73_m2} (ref >=60)
Globulin: 3.4 g/dL (calc) (ref 1.9–3.7)
Glucose, Bld: 61 mg/dL — ABNORMAL LOW (ref 65–99)
Potassium: 4.1 mmol/L (ref 3.5–5.3)
Sodium: 141 mmol/L (ref 135–146)
Total Bilirubin: 0.6 mg/dL (ref 0.2–1.2)
Total Protein: 7.4 g/dL (ref 6.1–8.1)

## 2018-07-13 LAB — POCT GLYCOSYLATED HEMOGLOBIN (HGB A1C): HbA1c, POC (controlled diabetic range): 8.8 % — AB (ref 0.0–7.0)

## 2018-07-13 MED ORDER — CEFUROXIME AXETIL 250 MG PO TABS: 250.0000 mg | ORAL_TABLET | Freq: Two times a day (BID) | ORAL | 0 refills | Status: AC

## 2018-07-13 MED ORDER — DOXYCYCLINE HYCLATE 100 MG PO TABS: 100.0000 mg | ORAL_TABLET | Freq: Two times a day (BID) | ORAL | 0 refills | Status: AC

## 2018-07-13 NOTE — Progress Notes (Signed)
HPI:                                                                Jonathan Armstrong is a 33 y.o. [facility] today for wound of right ankle  Jonathan Armstrong is a 33 yo M with PMH of uncontrolled Type 1 diabetes who presents with a wound of his right lateral ankle. Describes having a dime-sized area of redness that he describes as irritation for about 6 weeks. Over the past two weeks he noticed that the area of redness was widening and skin in the center was starting to break down. Yesterday area became very painful and his foot and ankle became swollen. He endorses chills, denies fever or malaise.    Past Medical History:  Diagnosis Date   Chronic cough    Diabetes mellitus without complication (HCC)    Diabetic retinopathy associated with type 1 diabetes mellitus (HCC) 02/14/2017   Seizure (HCC)    Past Surgical History:  Procedure Laterality Date   EYE SURGERY     Social History   Tobacco Use   Smoking status: Never Smoker   Smokeless tobacco: Never Used  Substance Use Topics   Alcohol use: No   family history is not on file.    ROS: negative except as noted in the HPI  Medications: Current Outpatient Medications  Medication Sig Dispense Refill   cetirizine (ZYRTEC) 10 MG tablet Take 1 tablet (10 mg total) daily by mouth. 30 tablet 11   fluticasone (FLONASE) 50 MCG/ACT nasal spray Place 1 spray into both nostrils at bedtime. 16 g 1   glucose blood test strip 1 each by Other route as needed for other. Use as instructed     insulin aspart (NOVOLOG) 100 UNIT/ML injection Inject into the skin 3 (three) times daily before meals.     insulin NPH Human (HUMULIN N,NOVOLIN N) 100 UNIT/ML injection Inject 15 Units 2 (two) times daily into the skin.     insulin regular (NOVOLIN R,HUMULIN R) 100 units/mL injection Inject 15 Units 2 (two) times daily before a meal into the skin.      montelukast (SINGULAIR)  10 MG tablet Take 1 tablet (10 mg total) at bedtime by mouth. 30 tablet 3   omeprazole (PRILOSEC) 40 MG capsule Take 1 capsule (40 mg total) by mouth daily. 30 capsule 0   cefUROXime (CEFTIN) 250 MG tablet Take 1 tablet (250 mg total) by mouth 2 (two) times daily with a meal for 7 days. 14 tablet 0   doxycycline (VIBRA-TABS) 100 MG tablet Take 1 tablet (100 mg total) by mouth 2 (two) times daily for 7 days. 14 tablet 0   No current facility-administered medications for this visit.    No Known Allergies     Objective:  BP (!) 142/89    Pulse 95    Temp 98.5 F (36.9 C) (Oral)    Wt 154 lb (69.9 kg)    SpO2 97%    BMI 22.74 kg/m  Gen:  alert, not ill-appearing, no distress, appropriate for age HEENT: head normocephalic without obvious abnormality, conjunctiva and cornea clear, wearing glasses, trachea midline Pulm: Normal work of breathing, normal phonation,  Neuro: alert and oriented x 3, no  tremor MSK: extremities atraumatic, normal gait and station, non-pitting edema of the right foot and ankle Skin: approx 4 cm x 2 cm area of redness of the right lateral malleolus containing an inner ulcerated area measuring approx 2 cm x 1 cm   Diabetic Foot Exam - Simple   Simple Foot Form Diabetic Foot exam was performed with the following findings:  Yes 07/13/2018  3:30 PM  Visual Inspection See comments:  Yes Sensation Testing See comments:  Yes Pulse Check Posterior Tibialis and Dorsalis pulse intact bilaterally:  Yes Comments Edema of right foot and ankle with 4 cm x 2 cm ulceration of lateral malleolus R - 1, 7, 8 intact, absent to all other locations L - intact to all except 2, 9, 10          Results for orders placed or performed in visit on 07/13/18 (from the past 72 hour(s))  POCT HgB A1C     Status: Abnormal   Collection Time: 07/13/18  3:12 PM  Result Value Ref Range   Hemoglobin A1C     HbA1c POC (<> result, manual entry)     HbA1c, POC (prediabetic range)      HbA1c, POC (controlled diabetic range) 8.8 (A) 0.0 - 7.0 %   No results found.    Assessment and Plan: 33 y.o. 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 -     cefUROXime (CEFTIN) 250 MG tablet; Take 1 tablet (250 mg total) by mouth 2 (two) times daily with a meal for 7 days. -     DG Ankle Complete Right -     CBC with Differential/Platelet -     WOUND CULTURE  Diabetic polyneuropathy associated with type 1 diabetes mellitus (HCC)  Afebrile, no tachycardia, hypertensive, not ill-appearing We discussed differential diagnosis of cellulitis versus osteomyelitis, especially given chronic duration of this ulcer Patient is self-pay and would like to defer MRI as much as possible Wound culture pending CBC with differential pending Will cover for MRSA with doxycycline and strep with cefuroxime Close follow-up in 3 to 4 days  As far as type 1 diabetes, A1c is 8.8 today, worsened from 8.0 1 year ago.  He has been managing his insulin on his own since he is uninsured and declined referral to endocrinology We discussed that he is experiencing complications of uncontrolled diabetes including neuropathy of bilateral feet and skin ulcer complicated by infection Referral placed to endocrinology today He is followed by [facility] in [facility] for retinopathy, records requested today  Patient education and anticipatory guidance given Patient agrees with treatment plan  Levonne Hubert PA-C

## 2018-07-13 NOTE — Patient Instructions (Addendum)
Set up MyChart patient portal Send Message with updates over the weekend If better, keep e-visit with Vinetta BergamoCharley for Monday If worsened, switch to an office visit with Dr. Denyse Amassorey   Cellulitis, Adult  Cellulitis is a skin infection. The infected area is usually warm, red, swollen, and tender. This condition occurs most often in the arms and lower legs. The infection can travel to the muscles, blood, and underlying tissue and become serious. It is very important to get treated for this condition. What are the causes? Cellulitis is caused by bacteria. The bacteria enter through a break in the skin, such as a cut, burn, insect bite, open sore, or crack. What increases the risk? This condition is more likely to occur in people who:  Have a weak body defense system (immune system).  Have open wounds on the skin, such as cuts, burns, bites, and scrapes. Bacteria can enter the body through these open wounds.  Are older than 35 years of age.  Have diabetes.  Have a type of long-lasting (chronic) liver disease (cirrhosis) or kidney disease.  Are obese.  Have a skin condition such as: ? Itchy rash (eczema). ? Slow movement of blood in the veins (venous stasis). ? Fluid buildup below the skin (edema).  Have had radiation therapy.  Use IV drugs. What are the signs or symptoms? Symptoms of this condition include:  Redness, streaking, or spotting on the skin.  Swollen area of the skin.  Tenderness or pain when an area of the skin is touched.  Warm skin.  A fever.  Chills.  Blisters. How is this diagnosed? This condition is diagnosed based on a medical history and physical exam. You may also have tests, including:  Blood tests.  Imaging tests. How is this treated? Treatment for this condition may include:  Medicines, such as antibiotic medicines or medicines to treat allergies (antihistamines).  Supportive care, such as rest and application of cold or warm cloths (compresses)  to the skin.  Hospital care, if the condition is severe. The infection usually starts to get better within 1-2 days of treatment. Follow these instructions at home:  Medicines  Take over-the-counter and prescription medicines only as told by your health care provider.  If you were prescribed an antibiotic medicine, take it as told by your health care provider. Do not stop taking the antibiotic even if you start to feel better. General instructions  Drink enough fluid to keep your urine pale yellow.  Do not touch or rub the infected area.  Raise (elevate) the infected area above the level of your heart while you are sitting or lying down.  Apply warm or cold compresses to the affected area as told by your health care provider.  Keep all follow-up visits as told by your health care provider. This is important. These visits let your health care provider make sure a more serious infection is not developing. Contact a health care provider if:  You have a fever.  Your symptoms do not begin to improve within 1-2 days of starting treatment.  Your bone or joint underneath the infected area becomes painful after the skin has healed.  Your infection returns in the same area or another area.  You notice a swollen bump in the infected area.  You develop new symptoms.  You have a general ill feeling (malaise) with muscle aches and pains. Get help right away if:  Your symptoms get worse.  You feel very sleepy.  You develop vomiting or diarrhea that  persists.  You notice red streaks coming from the infected area.  Your red area gets larger or turns dark in color. These symptoms may represent a serious problem that is an emergency. Do not wait to see if the symptoms will go away. Get medical help right away. Call your local emergency services (911 in the [place] Do not drive yourself to the hospital. Summary  Cellulitis is a skin infection. This condition occurs most often in the  arms and lower legs.  Treatment for this condition may include medicines, such as antibiotic medicines or antihistamines.  Take over-the-counter and prescription medicines only as told by your health care provider. If you were prescribed an antibiotic medicine, do not stop taking the antibiotic even if you start to feel better.  Contact a health care provider if your symptoms do not begin to improve within 1-2 days of starting treatment or your symptoms get worse.  Keep all follow-up visits as told by your health care provider. This is important. These visits let your health care provider make sure that a more serious infection is not developing. This information is not intended to replace advice given to you by your health care provider. Make sure you discuss any questions you have with your health care provider. Document Released: 12/23/2004 Document Revised: 08/04/2017 Document Reviewed: 08/04/2017 Elsevier Interactive Patient Education  2019 [employer]   Diabetes Mellitus and Foot Care Foot care is an important part of your health, especially when you have diabetes. Diabetes may cause you to have problems because of poor blood flow (circulation) to your feet and legs, which can cause your skin to:  Become thinner and drier.  Break more easily.  Heal more slowly.  Peel and crack. You may also have nerve damage (neuropathy) in your legs and feet, causing decreased feeling in them. This means that you may not notice minor injuries to your feet that could lead to more serious problems. Noticing and addressing any potential problems early is the best way to prevent future foot problems. How to care for your feet Foot hygiene  Wash your feet daily with warm water and mild soap. Do not use hot water. Then, pat your feet and the areas between your toes until they are completely dry. Do not soak your feet as this can dry your skin.  Trim your toenails straight across. Do not dig under  them or around the cuticle. File the edges of your nails with an emery board or nail file.  Apply a moisturizing lotion or petroleum jelly to the skin on your feet and to dry, brittle toenails. Use lotion that does not contain alcohol and is unscented. Do not apply lotion between your toes. Shoes and socks  Wear clean socks or stockings every day. Make sure they are not too tight. Do not wear knee-high stockings since they may decrease blood flow to your legs.  Wear shoes that fit properly and have enough cushioning. Always look in your shoes before you put them on to be sure there are no objects inside.  To break in new shoes, wear them for just a few hours a day. This prevents injuries on your feet. Wounds, scrapes, corns, and calluses  Check your feet daily for blisters, cuts, bruises, sores, and redness. If you cannot see the bottom of your feet, use a mirror or ask someone for help.  Do not cut corns or calluses or try to remove them with medicine.  If you find a minor scrape,  cut, or break in the skin on your feet, keep it and the skin around it clean and dry. You may clean these areas with mild soap and water. Do not clean the area with peroxide, alcohol, or iodine.  If you have a wound, scrape, corn, or callus on your foot, look at it several times a day to make sure it is healing and not infected. Check for: ? Redness, swelling, or pain. ? Fluid or blood. ? Warmth. ? Pus or a bad smell. General instructions  Do not cross your legs. This may decrease blood flow to your feet.  Do not use heating pads or hot water bottles on your feet. They may burn your skin. If you have lost feeling in your feet or legs, you may not know this is happening until it is too late.  Protect your feet from hot and cold by wearing shoes, such as at the beach or on hot pavement.  Schedule a complete foot exam at least once a year (annually) or more often if you have foot problems. If you have foot  problems, report any cuts, sores, or bruises to your health care provider immediately. Contact a health care provider if:  You have a medical condition that increases your risk of infection and you have any cuts, sores, or bruises on your feet.  You have an injury that is not healing.  You have redness on your legs or feet.  You feel burning or tingling in your legs or feet.  You have pain or cramps in your legs and feet.  Your legs or feet are numb.  Your feet always feel cold.  You have pain around a toenail. Get help right away if:  You have a wound, scrape, corn, or callus on your foot and: ? You have pain, swelling, or redness that gets worse. ? You have fluid or blood coming from the wound, scrape, corn, or callus. ? Your wound, scrape, corn, or callus feels warm to the touch. ? You have pus or a bad smell coming from the wound, scrape, corn, or callus. ? You have a fever. ? You have a red line going up your leg. Summary  Check your feet every day for cuts, sores, red spots, swelling, and blisters.  Moisturize feet and legs daily.  Wear shoes that fit properly and have enough cushioning.  If you have foot problems, report any cuts, sores, or bruises to your health care provider immediately.  Schedule a complete foot exam at least once a year (annually) or more often if you have foot problems. This information is not intended to replace advice given to you by your health care provider. Make sure you discuss any questions you have with your health care provider. Document Released: 03/12/2000 Document Revised: 04/27/2017 Document Reviewed: 04/16/2016 Elsevier Interactive Patient Education  2019 [employer].

## 2018-07-16 LAB — WOUND CULTURE
MICRO NUMBER:: 401949
SPECIMEN QUALITY:: ADEQUATE

## 2018-07-17 ENCOUNTER — Ambulatory Visit (INDEPENDENT_AMBULATORY_CARE_PROVIDER_SITE_OTHER): Payer: Self-pay | Admitting: Physician Assistant

## 2018-07-17 ENCOUNTER — Encounter: Payer: Self-pay | Admitting: Family Medicine

## 2018-07-17 VITALS — BP 119/78 | HR 92 | Temp 97.0°F | Ht 69.0 in | Wt 153.0 lb

## 2018-07-17 DIAGNOSIS — E10622 Type 1 diabetes mellitus with other skin ulcer: Secondary | ICD-10-CM

## 2018-07-17 DIAGNOSIS — L98499 Non-pressure chronic ulcer of skin of other sites with unspecified severity: Secondary | ICD-10-CM | POA: Insufficient documentation

## 2018-07-17 DIAGNOSIS — L03115 Cellulitis of right lower limb: Secondary | ICD-10-CM | POA: Insufficient documentation

## 2018-07-17 NOTE — Progress Notes (Signed)
Note opened in error.

## 2018-07-17 NOTE — Progress Notes (Signed)
Virtual Visit via Video Note  I connected with Jonathan Armstrong on 07/17/18 at 10:30 AM EDT by a video enabled telemedicine application and verified that I am speaking with the correct person using two identifiers.   I discussed the limitations of evaluation and management by telemedicine and the availability of in person appointments. The patient expressed understanding and agreed to proceed.  History of Present Illness: HPI:                                                                Jonathan Armstrong is a 33 y.o. male   CC: wound check  Jonathan Armstrong presented 4 days ago with an infected diabetic ulcer of his right lateral malleolus with surrounding cellulitis.  He was treated with oral antibiotics (Doxy and Ceftin) His wound culture grew S. Aureus that was pansensitive to everything except Macrolides Ankle x-ray was negative apart from soft tissue swelling His foot exam showed peripheral neuropathy of both feet.  Overall symptoms are unchanged today. Endorses some new onset burning of his right foot. Still having sweats and chills. Swelling of foot and ankle is unchanged. Has not had any increased redness, warmth or drainage. Also endorses hyperglycemia Morning fasting 215 today, 230, 229 over the weekend  Reports appetite is decreased and he is eating less than he usually does due to the antibiotics causing upset stomach. No vomiting or diarrhea   Past Medical History:  Diagnosis Date  . Chronic cough   . Diabetes mellitus without complication (HCC)   . Diabetic retinopathy associated with type 1 diabetes mellitus (HCC) 02/14/2017  . Seizure [facility])    Past Surgical History:  Procedure Laterality Date  . EYE SURGERY     Social History   Tobacco Use  . Smoking status: Never Smoker  . Smokeless tobacco: Never Used  Substance Use Topics  . Alcohol use: No   family history is not on file.    ROS: negative except as noted in the HPI  Medications: Current Outpatient  Medications  Medication Sig Dispense Refill  . cefUROXime (CEFTIN) 250 MG tablet Take 1 tablet (250 mg total) by mouth 2 (two) times daily with a meal for 7 days. 14 tablet 0  . cetirizine (ZYRTEC) 10 MG tablet Take 1 tablet (10 mg total) daily by mouth. 30 tablet 11  . doxycycline (VIBRA-TABS) 100 MG tablet Take 1 tablet (100 mg total) by mouth 2 (two) times daily for 7 days. 14 tablet 0  . fluticasone (FLONASE) 50 MCG/ACT nasal spray Place 1 spray into both nostrils at bedtime. 16 g 1  . glucose blood test strip 1 each by Other route as needed for other. Use as instructed    . insulin aspart (NOVOLOG) 100 UNIT/ML injection Inject into the skin 3 (three) times daily before meals.    . insulin NPH Human (HUMULIN N,NOVOLIN N) 100 UNIT/ML injection Inject 15 Units 2 (two) times daily into the skin.    Marland Kitchen insulin regular (NOVOLIN R,HUMULIN R) 100 units/mL injection Inject 15 Units 2 (two) times daily before a meal into the skin.     . montelukast (SINGULAIR) 10 MG tablet Take 1 tablet (10 mg total) at bedtime by mouth. 30 tablet 3  . omeprazole (PRILOSEC) 40 MG capsule Take 1 capsule (40  mg total) by mouth daily. 30 capsule 0   No current facility-administered medications for this visit.    No Known Allergies     Objective:  BP 119/78   Pulse 92   Temp (!) 97 F (36.1 C) (Oral)   Ht 5\' 9"  (1.753 m)   Wt 153 lb (69.4 kg)   BMI 22.59 kg/m  Gen:  alert, not ill-appearing, no distress, appropriate for age HEENT: head normocephalic without obvious abnormality, conjunctiva and cornea clear, trachea midline Pulm: Normal work of breathing, normal phonation, Neuro: alert and oriented x 3 Skin: exam limited due to video quality, ulcer of right lateral malleolus appears unchanged from prior exam   No results found for this or any previous visit (from the past 72 hour(s)). Dg Ankle Complete Right  Result Date: 07/13/2018 CLINICAL DATA:  Right ankle pain and swelling for the past 3 days.  Lateral malleolar ulcer. EXAM: RIGHT ANKLE - COMPLETE 3+ VIEW COMPARISON:  None. FINDINGS: No acute fracture or dislocation. No osseous destruction or periosteal reaction. The ankle mortise is symmetric. The talar dome is intact. No tibiotalar joint effusion. Joint spaces are preserved. Bone mineralization is normal. Diffuse soft tissue swelling of the lower leg, ankle, and foot. Faint vascular calcifications. IMPRESSION: 1. Diffuse soft tissue swelling.  No acute osseous abnormality. Electronically Signed   By: Obie DredgeWilliam T Derry M.D.   On: 07/13/2018 16:16      Assessment and Plan: 33 y.o. 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 The patient was provided an opportunity to ask questions and all were answered. The patient agreed with the plan and demonstrated an understanding of the instructions.   The patient was advised to call back or seek an in-person evaluation if the symptoms worsen or if the condition fails to improve as anticipated.  I provided 10 minutes of non-face-to-face time during this encounter.   Carlis Stableharley Elizabeth Cummings, [place]PA-C

## 2018-07-18 ENCOUNTER — Encounter: Payer: Self-pay | Admitting: Physician Assistant

## 2018-07-18 DIAGNOSIS — E1042 Type 1 diabetes mellitus with diabetic polyneuropathy: Secondary | ICD-10-CM | POA: Insufficient documentation

## 2018-07-20 ENCOUNTER — Encounter: Payer: Self-pay | Admitting: Physician Assistant

## 2018-07-20 ENCOUNTER — Ambulatory Visit (INDEPENDENT_AMBULATORY_CARE_PROVIDER_SITE_OTHER): Payer: Self-pay | Admitting: Physician Assistant

## 2018-07-20 VITALS — BP 117/82 | HR 90 | Temp 98.1°F | Wt 157.0 lb

## 2018-07-20 DIAGNOSIS — E10622 Type 1 diabetes mellitus with other skin ulcer: Secondary | ICD-10-CM

## 2018-07-20 DIAGNOSIS — L98499 Non-pressure chronic ulcer of skin of other sites with unspecified severity: Secondary | ICD-10-CM

## 2018-07-20 NOTE — Progress Notes (Signed)
HPI:                                                                Jonathan Armstrong is a 33 y.o. [facility] today for cellulitis / wound follow-up  Jonathan Armstrong presented 1 week ago with an infected diabetic ulcer of his right lateral malleolus with surrounding cellulitis.  He was treated with oral antibiotics (Doxy and Ceftin) His wound culture grew S. Aureus that was pansensitive to everything except Macrolides His CBC was unremarkable. No leukocytosis Ankle x-ray was negative apart from soft tissue swelling His foot exam showed peripheral neuropathy of both feet.  Overall symptoms are improved today. Continues to endorse some burning pain in his ankle and has been having muscle cramps of his right calf. Swelling of foot/ankle nearly resolved. Swelling of foot and ankle is unchanged. Has not had any increased redness, warmth or drainage.   Past Medical History:  Diagnosis Date  . Chronic cough   . Diabetic retinopathy associated with type 1 diabetes mellitus (HCC) 02/14/2017  . Seizure (HCC)   . Type 1 diabetes mellitus (HCC)    Past Surgical History:  Procedure Laterality Date  . EYE SURGERY     Social History   Tobacco Use  . Smoking status: Never Smoker  . Smokeless tobacco: Never Used  Substance Use Topics  . Alcohol use: No   family history is not on file.    ROS: negative except as noted in the HPI  Medications: Current Outpatient Medications  Medication Sig Dispense Refill  . cefUROXime (CEFTIN) 250 MG tablet Take 1 tablet (250 mg total) by mouth 2 (two) times daily with a meal for 7 days. 14 tablet 0  . cetirizine (ZYRTEC) 10 MG tablet Take 1 tablet (10 mg total) daily by mouth. 30 tablet 11  . doxycycline (VIBRA-TABS) 100 MG tablet Take 1 tablet (100 mg total) by mouth 2 (two) times daily for 7 days. 14 tablet 0  . fluticasone (FLONASE) 50 MCG/ACT nasal spray Place 1 spray into both nostrils  at bedtime. 16 g 1  . glucose blood test strip 1 each by Other route as needed for other. Use as instructed    . insulin aspart (NOVOLOG) 100 UNIT/ML injection Inject into the skin 3 (three) times daily before meals.    . insulin NPH Human (HUMULIN N,NOVOLIN N) 100 UNIT/ML injection Inject 15 Units 2 (two) times daily into the skin.    Marland Kitchen. insulin regular (NOVOLIN R,HUMULIN R) 100 units/mL injection Inject 15 Units 2 (two) times daily before a meal into the skin.     . montelukast (SINGULAIR) 10 MG tablet Take 1 tablet (10 mg total) at bedtime by mouth. 30 tablet 3  . omeprazole (PRILOSEC) 40 MG capsule Take 1 capsule (40 mg total) by mouth daily. 30 capsule 0   No current facility-administered medications for this visit.    No Known Allergies     Objective:  BP 117/82   Pulse 90   Temp 98.1 F (36.7 C) (Oral)   Wt 157 lb (71.2 kg)   SpO2 99%   BMI 23.18 kg/m  Gen:  alert, not ill-appearing, no distress, appropriate for age HEENT: head normocephalic without obvious abnormality, conjunctiva  and cornea clear, trachea midline Pulm: Normal work of breathing, normal phonation, clear to auscultation bilaterally, no wheezes, rales or rhonchi CV: Normal rate, regular rhythm, s1 and s2 distinct, no murmurs, clicks or rubs  Neuro: alert and oriented x 3, no tremor MSK: extremities atraumatic, normal gait and station, no peripheral edema Skin: right lateral malleolus - interval decrease in size of ulcer to approx 1 cm x 1 cm, small amount of serous drainage, surrounding erythema nearly resolved  07/13/18   07/20/18  Lab Results  Component Value Date   WBC 5.9 07/13/2018   HGB 13.2 07/13/2018   HCT 38.6 07/13/2018   MCV 85.6 07/13/2018   PLT 324 07/13/2018      Assessment and Plan: 33 y.o. 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

## 2018-08-07 ENCOUNTER — Encounter: Payer: Self-pay | Admitting: Internal Medicine

## 2018-08-07 ENCOUNTER — Ambulatory Visit: Payer: Self-pay | Admitting: Internal Medicine

## 2018-08-07 ENCOUNTER — Other Ambulatory Visit: Payer: Self-pay

## 2018-08-07 VITALS — BP 118/70 | HR 70 | Temp 98.4°F | Ht 69.0 in | Wt 159.0 lb

## 2018-08-07 DIAGNOSIS — E1042 Type 1 diabetes mellitus with diabetic polyneuropathy: Secondary | ICD-10-CM

## 2018-08-07 MED ORDER — GLUCAGON (RDNA) 1 MG IJ KIT
1.0000 mg | PACK | Freq: Once | INTRAMUSCULAR | 12 refills | Status: DC | PRN
Start: 2018-08-07 — End: 2021-01-05

## 2018-08-07 MED ORDER — INSULIN LISPRO 100 UNIT/ML ~~LOC~~ SOLN: 12.0000 [IU] | Freq: Two times a day (BID) | SUBCUTANEOUS | 3 refills | Status: DC

## 2018-08-07 NOTE — Progress Notes (Signed)
Patient ID: Jonathan Armstrong, male   DOB: [birth year], 33 y.o.   MRN: [id number]   HPI: Jonathan Armstrong is a 33 y.o.-year-old male, referred by his PCP, Trixie Dredge, PA-C, for management of DM1, dx 68 (33 y/o), uncontrolled, with complications (peripheral neuropathy, diabetic foot ulcer, diabetic retinopathy, history of seizure x1 2/2 hypoglycemia). He moved to [place] in 2017 from [place]  He did not try an insulin pump as this was too expensive.  He had a foot infection 1 mo ago >> sugars higher. Now resolved.  Last hemoglobin A1c was: Lab Results  Component Value Date   HGBA1C 8.8 (A) 07/13/2018   HGBA1C 8.0 02/14/2017   HGBA1C 8.1 04/02/2016   Pt is on - ReliOn insulin - mixes them: -NPH 15 units twice a day (tried to move second NPH dose at BT >> sugars higher in am) -Regular insulin/Humalog 12-15 units twice a day with B and D as he drops sugars if takes this with L.  Meter: AccuCheck  Pt checks his sugars 0-2x a day and they are: - am: 120-170 - 2h after b'fast: n/c - before lunch: n/c - 2h after lunch: n/c - before dinner: 80-120 - 2h after dinner: n/c - bedtime: n/c - nighttime: n/c + lows. Lowest sugar was 17 (!) >> seizure 2018; 35 - seldom - between 10 pm -1 am; he has hypoglycemia awareness at 70. No previous hypoglycemia admission. Does not have a glucagon kit at home! Highest sugar was 670 long time; last few mo: 360 (infection). No previous DKA admissions.    Pt's meals are: - Breakfast: 2/3 bowl oatmeal, butter, salt (12-15 units)  - Lunch: PBJ sandwich or a bowl of noodles (0 units) - Dinner: meat + veggies+ starch (45-60 CHO) - Snacks: crackers Saw nutrition long time ago 2006-2008.  - no CKD, last BUN/creatinine:  Lab Results  Component Value Date   BUN 14 07/13/2018   BUN 15 10/28/2015   CREATININE 0.84 07/13/2018   CREATININE 0.89 10/28/2015  + Urinary infections after dx >> resolved.  - last set of lipids: No results found for:  CHOL, HDL, LDLCALC, LDLDIRECT, TRIG, CHOLHDL   - last eye exam was in 05/2018. + DR. + IO injections. Dr. Jalene Mullet (who also his boss).  He had surgery for removal of retinal scarring 2018.  - + numbness, but no pain or tingling in his feet.  Last TSH: Lab Results  Component Value Date   TSH 1.09 10/28/2015   Pt has FH of DM1 in Paternal Great uncle. MGF and MGM had DM2.  ROS: Constitutional: + weight gain, no weight loss, + fatigue, no subjective hyperthermia, no subjective hypothermia, no nocturia Eyes: no blurry vision, no xerophthalmia ENT: no sore throat, no nodules palpated in neck, no dysphagia, no odynophagia, no hoarseness, no tinnitus, no hypoacusis Cardiovascular: no CP, no SOB, no palpitations, no leg swelling Respiratory: + cough (allergies?), no SOB, no wheezing Gastrointestinal: no N, no V, no D, no C, no acid reflux Musculoskeletal: no muscle, no joint aches Skin: no rash, no hair loss Neurological: no tremors, no numbness or tingling/no dizziness/no HAs Psychiatric: + depression (better), + anxiety  He exercises twice a week.  Past Medical History:  Diagnosis Date  . Chronic cough   . Diabetic retinopathy associated with type 1 diabetes mellitus ([place]) 02/14/2017  . Seizure ([place])   . Type 1 diabetes mellitus ([place])    Past Surgical History:  Procedure Laterality Date  . EYE SURGERY  Social History   Socioeconomic History  . Marital status: Single    Spouse name: Not on file  . Number of children: 0 -he would not want to have children to avoid the risk of giving them type 1 diabetes  . Years of education: Not on file  . Highest education level: Not on file  Occupational History  .  IT/software development  Social Needs  . Financial resource strain: Not on file  . Food insecurity:    Worry: Not on file    Inability: Not on file  . Transportation needs:    Medical: Not on file    Non-medical: Not on file  Tobacco Use  . Smoking status:  Never Smoker  . Smokeless tobacco: Never Used  Substance and Sexual Activity  . Alcohol use: No  . Drug use: Never  . Sexual activity: Not Currently  Lifestyle  . Physical activity:    Days per week: Not on file    Minutes per session: Not on file  . Stress: Not on file  Relationships  . Social connections:    Talks on phone: Not on file    Gets together: Not on file    Attends religious service: Not on file    Active member of club or organization: Not on file    Attends meetings of clubs or organizations: Not on file    Relationship status: Not on file  . Intimate partner violence:    Fear of current or ex partner: Not on file    Emotionally abused: Not on file    Physically abused: Not on file    Forced sexual activity: Not on file  Other Topics Concern  . Not on file  Social History Narrative  . Not on file   Current Outpatient Medications on File Prior to Visit  Medication Sig Dispense Refill  . cetirizine (ZYRTEC) 10 MG tablet Take 1 tablet (10 mg total) daily by mouth. 30 tablet 11  . fluticasone (FLONASE) 50 MCG/ACT nasal spray Place 1 spray into both nostrils at bedtime. 16 g 1  . glucose blood test strip 1 each by Other route as needed for other. Use as instructed    . insulin NPH Human (HUMULIN N,NOVOLIN N) 100 UNIT/ML injection Inject 15 Units 2 (two) times daily into the skin.    Marland Kitchen insulin regular (NOVOLIN R,HUMULIN R) 100 units/mL injection Inject 15 Units 2 (two) times daily before a meal into the skin.     . montelukast (SINGULAIR) 10 MG tablet Take 1 tablet (10 mg total) at bedtime by mouth. 30 tablet 3  . omeprazole (PRILOSEC) 40 MG capsule Take 1 capsule (40 mg total) by mouth daily. 30 capsule 0  . insulin aspart (NOVOLOG) 100 UNIT/ML injection Inject into the skin 3 (three) times daily before meals.     No current facility-administered medications on file prior to visit.    No Known Allergies No family history on file.  PE: BP 118/70   Pulse 70    Temp 98.4 F (36.9 C)   Ht 5' 9"  (1.753 m)   Wt 159 lb (72.1 kg)   SpO2 99%   BMI 23.48 kg/m  Wt Readings from Last 3 Encounters:  08/07/18 159 lb (72.1 kg)  07/20/18 157 lb (71.2 kg)  07/17/18 153 lb (69.4 kg)   Constitutional: normal weight, in NAD Eyes: PERRLA, EOMI, no exophthalmos ENT: moist mucous membranes, no thyromegaly, no cervical lymphadenopathy Cardiovascular: RRR, No MRG Respiratory: CTA B Gastrointestinal:  abdomen soft, NT, ND, BS+ Musculoskeletal: no deformities, strength intact in all 4 Skin: moist, warm, no rashes Neurological: no tremor with outstretched hands, DTR normal in all 4  ASSESSMENT: 1. DM1, uncontrolled, with complications - PN - Diabetic foot ulcer - DR - History of seizures  PLAN:  1. Patient with long-standing, uncontrolled DM1, on basal-bolus insulin regimen.  Unfortunately, he does not have insurance and it is very difficult for him to afford a more complex medication regimen.  He obtained Humalog from a colleague at work and this worked much better for him.  He only had one vial and he discussed with [employer] and will be able to obtain a 94-monthsupply -we discussed that this is definitely a better option the regular insulin and I gave him a written prescription for Humalog.  I would also like to switch him from NPH to [place] I doubt that this is affordable.  I gave him a brochure about receiver and advised him to let me know if we could start this.  At this point, his sugars are higher in the morning and he mentioned that they were even higher when he took NPH at bedtime.  However, I asked him to retry this since he does have low blood sugars in the middle of the night.  He injects regular insulin in the morning, but does not take it before lunch since NPH peaks around lunchtime compensating for his lack of insulin with this meal.  His sugars before dinner are at goal, so for now we will continue the practice of only taking short-acting insulin  twice a day. -We also discussed about the possible referral to nutrition but he would like to defer this for now. -He has a significant history of hypoglycemia induced seizure and has had a very low blood sugar in the middle of the night more recently.  We will move NPH at bedtime, but he would really benefit from a CGM.  Unfortunately, this is upper limits financially for him, as is an insulin pump.  He would be interested in both, though. - We discussed about changes to his insulin regimen, as follows:  Patient Instructions  Please continue: - NPH 15 units twice a day, but move the second dose at bedtime.  Change from regular insulin to: - Humalog 12-15 units twice a day 15 min before meals  Please let me know if the sugars are consistently <80 or >200.  Please come back for a follow-up appointment in 3 months.  - Strongly advised him to start checking sugars at different times of the day - check at least 2-3 times a day, rotating checks - given sugar log and advised how to fill it and to bring it at next appt  - given foot care handout and explained the principles  - given instructions for hypoglycemia management "15-15 rule"  - advised for yearly eye exams - sent a glucagon kit Rx to pharmacy, but he is not sure if he can afford it. - advised to get ketone strips - advised to always have Glu tablets with him - advised for a Med-alert bracelet mentioning "type 1 diabetes mellitus". - given instruction Re: exercising and driving in DM1 (pt instructions) - no signs of other autoimmune disorders, low need to check his TSH at next visit.  We will also add cholesterol levels then. - Return to clinic in 3 mo with sugar log   CPhilemon Kingdom MD PhD [facility]

## 2018-08-07 NOTE — Patient Instructions (Addendum)
Please continue: - NPH 15 units twice a day, but move the second dose at bedtime.  Change from regular insulin to: - Humalog 12-15 units twice a day 15 min before meals  Please let me know if the sugars are consistently <80 or >200.  Please come back for a follow-up appointment in 3 months.  Basic Rules for Patients with Type I Diabetes Mellitus  1. The American Diabetes Association (ADA) recommended targets: - fasting sugar <130 - after meal sugar <180 - HbA1C <7%  2. Engage in ?150 min moderate exercise per week  3. Make sure you have ?8h of sleep every night as this helps both blood sugars and your weight.  4. Always keep a sugar log (not only record in your meter) and bring it to all appointments with [place].  5. "15-15 rule" for hypoglycemia: if sugars are low, take 15 g of carbs** ("fast sugar" - e.g. 4 glucose tablets, 4 oz orange juice), wait 15 min, then check sugars again. If still <80, repeat. Continue  until your sugars >80, then eat a normal meal.   6. Teach family members and coworkers to inject glucagon. Have a glucagon set at home and one at work. They should call 911 after using the set.  7. Check sugar before driving. If <100, correct, and only start driving if sugars rise ?045. Check sugar every hour when on a long drive.  8. Check sugar before exercising. If <100, correct, and only start exercising if sugars rise ?100. Check sugar every hour when on a long exercise routine and 1h after you finished exercising.   If >250, check urine for ketones. If you have moderate-large ketones in urine, do not start exercise. Hydrate yourself with clear liquids and correct the high sugar. Recheck sugars and ketones before attempting to exercise.  Be aware that you might need less insulin when exercising.  *intense, short, exercise bursts can increase your sugars, but  *less intense, longer (>1h), exercise routines can decrease your sugars.   9. Make sure you have a MedAlert  bracelet or pendant mentioning "Type I Diabetes Mellitus". If you have a prior episode of severe hypoglycemia or hypoglycemia unawareness, it should also mention this.  10. Please do not walk barefoot. Inspect your feet for sores/cuts and let us know if you have them.   **E.g. of "fast carbs": ? first choice (15 g):  1 tube glucose gel, GlucoPouch 15, 2 oz glucose liquid ? second choice (15-16 g):  3 or 4 glucose tablets (best taken  with water), 15 Dextrose Bits chewable ? third choice (15-20 g):   cup fruit juice,  cup regular soda, 1 cup skim milk,  1 cup sports drink ? fourth choice (15-20 g):  1 small tube [place] gel (not frosting), 2 tbsp raisins, 1 tbsp table sugar,  candy, jelly beans, gum drops - check package for carb amount   (adapted from: Juluis Rainier. "Insulin therapy and hypoglycemia" Endocrinol Metab Clin N Am 2012, 41: 57-87)  Assistance for insulin: https://www.[facility]oc[facility].com/diabetes

## 2018-08-08 ENCOUNTER — Encounter: Payer: Self-pay | Admitting: Internal Medicine

## 2018-12-11 ENCOUNTER — Other Ambulatory Visit: Payer: Self-pay

## 2018-12-11 ENCOUNTER — Encounter: Payer: Self-pay | Admitting: Internal Medicine

## 2018-12-11 ENCOUNTER — Ambulatory Visit: Payer: Self-pay | Admitting: Internal Medicine

## 2018-12-11 VITALS — BP 110/72 | HR 101 | Wt 159.0 lb

## 2018-12-11 DIAGNOSIS — E1042 Type 1 diabetes mellitus with diabetic polyneuropathy: Secondary | ICD-10-CM

## 2018-12-11 LAB — LIPID PANEL
Cholesterol: 186 mg/dL (ref 0–200)
HDL: 70 mg/dL (ref >39.00)
LDL Cholesterol: 99 mg/dL (ref 0–99)
NonHDL: 116.11
Total CHOL/HDL Ratio: 3
Triglycerides: 86 mg/dL (ref 0.0–149.0)
VLDL: 17.2 mg/dL (ref 0.0–40.0)

## 2018-12-11 LAB — TSH: TSH: 0.7 u[IU]/mL (ref 0.35–4.50)

## 2018-12-11 LAB — POCT GLYCOSYLATED HEMOGLOBIN (HGB A1C): Hemoglobin A1C: 8.5 % — AB (ref 4.0–5.6)

## 2018-12-11 MED ORDER — INSULIN LISPRO 100 UNIT/ML ~~LOC~~ SOLN: 12.0000 [IU] | Freq: Two times a day (BID) | SUBCUTANEOUS | 3 refills | Status: DC

## 2018-12-11 NOTE — Patient Instructions (Addendum)
Please continue: - Humalog 12-13 units 15 min before meals (may need 15 units before a larger meal) - NPH 15 units 2x a day  START checking sugars 3-4x a day, 15 min before meals and at bedtime.  Please come back for a follow-up appointment in 3-4 months

## 2018-12-11 NOTE — Progress Notes (Signed)
Patient ID: Jonathan Armstrong, male   DOB: 01/16/1986, 33 y.o.   MRN: [id number]   HPI: Jonathan Armstrong is a 33 y.o.-year-old male, initially referred by his PCP, Trixie Dredge, PA-C , returning for f/u for DM1, dx 1998 (33 y/o), uncontrolled, with complications (peripheral neuropathy, diabetic foot ulcer, diabetic retinopathy, history of seizure x1 2/2 hypoglycemia). He moved to [place] in 2017 from [place]. Last OV4 mo ago.  He does not have insurance and cannot afford an insulin pump or CGM.  Since last visit, he was able to start Humalog if he gets it cheaper from [facility].  Last hemoglobin A1c was: Lab Results  Component Value Date   HGBA1C 8.8 (A) 07/13/2018   HGBA1C 8.0 02/14/2017   HGBA1C 8.1 04/02/2016   Pt was on - ReliOn insulins: -NPH 15 units twice a day (tried to move second NPH dose at BT >> sugars higher in am) -Regular insulin/Humalog 12-15 units twice a day with B and D as he drops sugars if takes this with L.  At last OV, we changed to: - NPH 15 units 2x a day, last dose at bedtime >> forgets second dose - Humalog 12-13 (not using:15) units 15 min before meals - now affordable  Meter: AccuCheck  Pt is not checking sugars! From last OV: - am: 120-170 - 2h after b'fast: n/c - before lunch: n/c - 2h after lunch: n/c - before dinner: 80-120 - 2h after dinner: n/c - bedtime: n/c - nighttime: n/c + lows. Lowest sugar was 17 (!) >> seizure 2018; 33 - seldom - between 10 pm -1 am >> 60; he has hypoglycemia awareness at 70. No previous hypoglycemia admission. Does not have a glucagon kit at home 2/2 price! Highest sugar was 670 long time ago; last few mo: 360 (infection) >> 312. No previous DKA admissions.    Pt's meals are: - Breakfast: 2/3 bowl oatmeal, butter, salt (12-15 units)  - Lunch: PBJ sandwich or a bowl of noodles (0 units) - Dinner: meat + veggies+ starch (45-60 CHO) - Snacks: crackers Saw nutrition long time ago 2006-2008.  - no CKD, last  BUN/creatinine:  Lab Results  Component Value Date   BUN 14 07/13/2018   BUN 15 10/28/2015   CREATININE 0.84 07/13/2018   CREATININE 0.89 10/28/2015  + Urinary infections after dx >> resolved.  - ? HL: last set of lipids: No results found for: CHOL, HDL, LDLCALC, LDLDIRECT, TRIG, CHOLHDL   - last eye exam was in 05/2018: + DR. He gets IO inj's - Avastin q 3 mo. Dr. Jalene Mullet (who also his boss).  He had surgery for removal of retinal scarring 2018.  - + numbness in feet  Last TSH reviewed >> normal:: Lab Results  Component Value Date   TSH 1.09 10/28/2015   Pt has FH of DM1 in Paternal Great uncle. MGF and MGM had DM2.  ROS: Constitutional: no weight gain/no weight loss, no fatigue, no subjective hyperthermia, no subjective hypothermia Eyes: no blurry vision, no xerophthalmia ENT: no sore throat, no nodules palpated in neck, no dysphagia, no odynophagia, no hoarseness Cardiovascular: no CP/no SOB/no palpitations/no leg swelling Respiratory: no cough/no SOB/no wheezing Gastrointestinal: no N/no V/no D/no C/no acid reflux Musculoskeletal: no muscle aches/no joint aches Skin: no rashes, no hair loss Neurological: no tremors/+ numbness/no tingling/no dizziness  I reviewed pt's medications, allergies, PMH, social hx, family hx, and changes were documented in the history of present illness. Otherwise, unchanged from my initial visit note.  He exercises twice a week.  Past Medical History:  Diagnosis Date  . Chronic cough   . Diabetic retinopathy associated with type 1 diabetes mellitus ([place]) 02/14/2017  . Seizure ([place])   . Type 1 diabetes mellitus ([place])    Past Surgical History:  Procedure Laterality Date  . EYE SURGERY     Social History   Socioeconomic History  . Marital status: Single    Spouse name: Not on file  . Number of children: 0 -he would not want to have children to avoid the risk of giving them type 1 diabetes  . Years of education: Not on file  .  Highest education level: Not on file  Occupational History  .  IT/software development  Social Needs  . Financial resource strain: Not on file  . Food insecurity:    Worry: Not on file    Inability: Not on file  . Transportation needs:    Medical: Not on file    Non-medical: Not on file  Tobacco Use  . Smoking status: Never Smoker  . Smokeless tobacco: Never Used  Substance and Sexual Activity  . Alcohol use: No  . Drug use: Never  . Sexual activity: Not Currently  Lifestyle  . Physical activity:    Days per week: Not on file    Minutes per session: Not on file  . Stress: Not on file  Relationships  . Social connections:    Talks on phone: Not on file    Gets together: Not on file    Attends religious service: Not on file    Active member of club or organization: Not on file    Attends meetings of clubs or organizations: Not on file    Relationship status: Not on file  . Intimate partner violence:    Fear of current or ex partner: Not on file    Emotionally abused: Not on file    Physically abused: Not on file    Forced sexual activity: Not on file  Other Topics Concern  . Not on file  Social History Narrative  . Not on file   Current Outpatient Medications on File Prior to Visit  Medication Sig Dispense Refill  . cetirizine (ZYRTEC) 10 MG tablet Take 1 tablet (10 mg total) daily by mouth. 30 tablet 11  . fluticasone (FLONASE) 50 MCG/ACT nasal spray Place 1 spray into both nostrils at bedtime. 16 g 1  . glucagon (GLUCAGON EMERGENCY) 1 MG injection Inject 1 mg into the muscle once as needed for up to 1 dose. 1 each 12  . glucose blood test strip 1 each by Other route as needed for other. Use as instructed    . insulin aspart (NOVOLOG) 100 UNIT/ML injection Inject into the skin 3 (three) times daily before meals.    . insulin lispro (HUMALOG) 100 UNIT/ML injection Inject 0.12-0.15 mLs (12-15 Units total) into the skin 2 (two) times daily before a meal. 40 mL 3  .  insulin NPH Human (HUMULIN N,NOVOLIN N) 100 UNIT/ML injection Inject 15 Units 2 (two) times daily into the skin.    Marland Kitchen insulin regular (NOVOLIN R,HUMULIN R) 100 units/mL injection Inject 15 Units 2 (two) times daily before a meal into the skin.     . montelukast (SINGULAIR) 10 MG tablet Take 1 tablet (10 mg total) at bedtime by mouth. 30 tablet 3  . omeprazole (PRILOSEC) 40 MG capsule Take 1 capsule (40 mg total) by mouth daily. 30 capsule 0   No  current facility-administered medications on file prior to visit.    No Known Allergies No family history on file.  PE: BP 110/72 (BP Location: Left Arm, Patient Position: Sitting, Cuff Size: Normal)   Pulse (!) 101   Wt 159 lb (72.1 kg)   SpO2 97%   BMI 23.48 kg/m  Wt Readings from Last 3 Encounters:  12/11/18 159 lb (72.1 kg)  08/07/18 159 lb (72.1 kg)  07/20/18 157 lb (71.2 kg)   Constitutional: normal weight, in NAD Eyes: PERRLA, EOMI, no exophthalmos ENT: moist mucous membranes, no thyromegaly, no cervical lymphadenopathy Cardiovascular: Tachycardia, RR, No MRG Respiratory: CTA B Gastrointestinal: abdomen soft, NT, ND, BS+ Musculoskeletal: no deformities, strength intact in all 4 Skin: moist, warm, no rashes Neurological: no tremor with outstretched hands, DTR normal in all 4  ASSESSMENT: 1. DM1, uncontrolled, with complications - PN - Diabetic foot ulcer - DR - History of seizures  PLAN:  1. Patient with longstanding, uncontrolled, type 1 diabetes, on basal-bolus insulin regimen.  Unfortunately, he does not have insurance and it is very difficult for him to afford a complex medication regimen.  He also cannot afford an insulin pump and CGM.  At last visit, I suggested to switch from regular insulin to Humalog, which he was able to find cheaper at [facility].  Today I sent a prescription to the pharmacy.  At today's visit, we checked if he could also switch NPH to [place], Lantus, Basaglar but unfortunately these are all too  expensive through good Rx.  Therefore, we will need to stay with NPH for now. -At this visit, unfortunately, she is not checking sugars and I strongly advised him to start.  I advised him to check approximately 15 minutes before each meal, when he is taking his insulin.  He will will start setting flashing alarms in his room as he mentions that if the alarms are on his phone, he ignores them.  Of note, he is not taking Humalog before lunch since his NPH peaks around that time and may drop his sugars if he also takes Humalog. -Since last visit, when we moved NPH at bedtime, he tells me that he forgets this more and will try to set alarms to try not to forget the dose.  We cannot change to a more long-acting analog insulin due to price. -I cannot change his regimen now since he is not checking sugars. -I suggested to: Patient Instructions  Please continue: - Humalog 12-13 units 15 min before meals (may need 15 units before a larger meal) - NPH 15 units 2x a day  START checking sugars 3-4x a day, 15 min before meals and at bedtime.  Please come back for a follow-up appointment in 3-4 months  - we checked his HbA1c: 8.5% (slightly better) - advised to check sugars at different times of the day - 3-4x a day, rotating check times - advised for yearly eye exams >> he is UTD - will check annual labs today - return to clinic in 3-4 months  - time spent with the patient: 15 minutes, of which >50% was spent in obtaining information about his diabetes, reviewing his previous labs, evaluations, and insulin doses, counseling him about his condition (please see the discussed topics above), and developing a plan to investigate and avoid hypo-and hyperglycemia.  Component     Latest Ref Rng & Units 12/11/2018  Glucose     65 - 99 mg/dL 283 (H)  BUN     7 - 25  mg/dL 15  Creatinine     0.60 - 1.35 mg/dL 1.09  GFR, Est Non African American     > OR = 60 mL/min/1.45m 88  GFR, Est African American     >  OR = 60 mL/min/1.763m102  BUN/Creatinine Ratio     6 - 22 (calc) NOT APPLICABLE  Sodium     13496 146 mmol/L 139  Potassium     3.5 - 5.3 mmol/L 5.3  Chloride     98 - 110 mmol/L 100  CO2     20 - 32 mmol/L 29  Calcium     8.6 - 10.3 mg/dL 10.1  Total Protein     6.1 - 8.1 g/dL 7.6  Albumin MSPROF     3.6 - 5.1 g/dL 4.3  Globulin     1.9 - 3.7 g/dL (calc) 3.3  AG Ratio     1.0 - 2.5 (calc) 1.3  Total Bilirubin     0.2 - 1.2 mg/dL 0.6  Alkaline phosphatase (APISO)     36 - 130 U/L 85  AST     10 - 40 U/L 18  ALT     9 - 46 U/L 24  Cholesterol     0 - 200 mg/dL 186  Triglycerides     0.0 - 149.0 mg/dL 86.0  HDL Cholesterol     >39.00 mg/dL 70.00  VLDL     0.0 - 40.0 mg/dL 17.2  LDL (calc)     0 - 99 mg/dL 99  Total CHOL/HDL Ratio      3  NonHDL      116.11  TSH     0.35 - 4.50 uIU/mL 0.70   Labs are normal with the exception of a high glucose.  CrPhilemon KingdomMD PhD [facility]

## 2018-12-12 LAB — COMPLETE METABOLIC PANEL WITH GFR
AG Ratio: 1.3 (calc) (ref 1.0–2.5)
ALT: 24 U/L (ref 9–46)
AST: 18 U/L (ref 10–40)
Albumin: 4.3 g/dL (ref 3.6–5.1)
Alkaline phosphatase (APISO): 85 U/L (ref 36–130)
BUN: 15 mg/dL (ref 7–25)
CO2: 29 mmol/L (ref 20–32)
Calcium: 10.1 mg/dL (ref 8.6–10.3)
Chloride: 100 mmol/L (ref 98–110)
Creat: 1.09 mg/dL (ref 0.60–1.35)
GFR, Est African American: 102 mL/min/{1.73_m2} (ref >=60)
GFR, Est Non African American: 88 mL/min/{1.73_m2} (ref >=60)
Globulin: 3.3 g/dL (calc) (ref 1.9–3.7)
Glucose, Bld: 283 mg/dL — ABNORMAL HIGH (ref 65–99)
Potassium: 5.3 mmol/L (ref 3.5–5.3)
Sodium: 139 mmol/L (ref 135–146)
Total Bilirubin: 0.6 mg/dL (ref 0.2–1.2)
Total Protein: 7.6 g/dL (ref 6.1–8.1)

## 2019-04-12 ENCOUNTER — Other Ambulatory Visit: Payer: Self-pay

## 2019-04-16 ENCOUNTER — Other Ambulatory Visit: Payer: Self-pay

## 2019-04-16 ENCOUNTER — Encounter: Payer: Self-pay | Admitting: Internal Medicine

## 2019-04-16 ENCOUNTER — Ambulatory Visit: Payer: Self-pay | Admitting: Internal Medicine

## 2019-04-16 VITALS — BP 130/80 | HR 97 | Ht 69.0 in | Wt 163.0 lb

## 2019-04-16 DIAGNOSIS — E1042 Type 1 diabetes mellitus with diabetic polyneuropathy: Secondary | ICD-10-CM

## 2019-04-16 LAB — POCT GLYCOSYLATED HEMOGLOBIN (HGB A1C): Hemoglobin A1C: 8.6 % — AB (ref 4.0–5.6)

## 2019-04-16 NOTE — Addendum Note (Signed)
Addended by: Darliss Ridgel I on: 04/16/2019 11:41 AM   Modules accepted: Orders

## 2019-04-16 NOTE — Patient Instructions (Addendum)
Please continue: - Humalog  12-15 units 15 min before meals  - NPH 15 units 2x a day  Start checking sugars 3-4 times a day, 15 minutes before meals and also occasionally at bedtime.  Please come back for a follow-up appointment in 4 months.

## 2019-04-16 NOTE — Progress Notes (Signed)
Patient ID: Jonathan Armstrong, male   DOB: Jul 31, 1985, 34 y.o.   MRN: [id number]   This visit occurred during the SARS-CoV-2 public health emergency.  Safety protocols were in place, including screening questions prior to the visit, additional usage of staff PPE, and extensive cleaning of exam room while observing appropriate contact time as indicated for disinfecting solutions.   HPI: Jonathan Armstrong is a 34 y.o.-year-old male, initially referred by his PCP, Trixie Dredge, PA-C , returning for f/u for DM1, dx 1998 (34 y/o), uncontrolled, with complications (peripheral neuropathy, diabetic foot ulcer, diabetic retinopathy, history of seizure x1 2/2 hypoglycemia). He moved to [place] in 2017 from [place]. Last OV 4 months ago.  Patient does not have insurance and cannot afford insulin pump or CGM.  Reviewed HbA1c levels: Lab Results  Component Value Date   HGBA1C 8.5 (A) 12/11/2018   HGBA1C 8.8 (A) 07/13/2018   HGBA1C 8.0 02/14/2017   Pt was on - ReliOn insulins: -NPH 15 units twice a day (tried to move second NPH dose at BT >> sugars higher in am) -Regular insulin/Humalog 12-15 units twice a day with B and D as he drops sugars if takes this with L.  Now on: - NPH 15 units 2x a day, last dose at bedtime (occasionally forgets the night dose) - Humalog 12-15 units 15 min before meals - now affordable  Meter: AccuCheck  She is still not checking sugars.  Previously: - am: 120-170 - 2h after b'fast: n/c - before lunch: n/c - 2h after lunch: n/c - before dinner: 80-120 - 2h after dinner: n/c - bedtime: n/c - nighttime: n/c + lows. Lowest sugar was 17 (!) >> seizure 2018; 34 - seldom - between 10 pm -1 am >> 60 >> ?; he has hypoglycemia awareness in the 70s.  No previous hypoglycemia admission.  He cannot afford a glucagon kit! Highest sugar was 670 long time ago; last few mo: 360 (infection) >> 312 >> ?.  No previous DKA admissions.    Pt's meals are: - Breakfast: 2/3 bowl  oatmeal, butter, salt (12-15 units)  - Lunch: PBJ sandwich or a bowl of noodles (0 units) - Dinner: meat + veggies+ starch (45-60 CHO) - Snacks: crackers Saw nutrition long time ago 2006-2008.  -No CKD, last BUN/creatinine:  Lab Results  Component Value Date   BUN 15 12/11/2018   BUN 14 07/13/2018   CREATININE 1.09 12/11/2018   CREATININE 0.84 07/13/2018  + Urinary infections after dx >> resolved.  -No HL: last set of lipids: Lab Results  Component Value Date   CHOL 186 12/11/2018   HDL 70.00 12/11/2018   LDLCALC 99 12/11/2018   TRIG 86.0 12/11/2018   CHOLHDL 3 12/11/2018    - last eye exam was in 05/2018: + DR.he has a Avastin intraocular injections every 3 months. Dr. Jalene Mullet (who also his boss).  He had surgery for removal of retinal scarring 2018.  -He has numbness in feet  Last TSH was reviewed and this was normal: Lab Results  Component Value Date   TSH 0.70 12/11/2018   Pt has FH of DM1 in Paternal Great uncle. MGF and MGM had DM2.  He would not want to have children to avoid the risk of getting type 1 diabetes.  ROS: Constitutional: no weight gain/no weight loss, no fatigue, no subjective hyperthermia, no subjective hypothermia Eyes: no blurry vision, no xerophthalmia ENT: no sore throat, no nodules palpated in neck, no dysphagia, no odynophagia, no hoarseness Cardiovascular:  no CP/no SOB/no palpitations/no leg swelling Respiratory: no cough/no SOB/no wheezing Gastrointestinal: no N/no V/no D/no C/no acid reflux Musculoskeletal: no muscle aches/no joint aches Skin: no rashes, no hair loss Neurological: no tremors/+ numbness/no tingling/no dizziness  I reviewed pt's medications, allergies, PMH, social hx, family hx, and changes were documented in the history of present illness. Otherwise, unchanged from my initial visit note.  Past Medical History:  Diagnosis Date  . Chronic cough   . Diabetic retinopathy associated with type 1 diabetes mellitus  ([place]) 02/14/2017  . Seizure ([place])   . Type 1 diabetes mellitus ([place])    Past Surgical History:  Procedure Laterality Date  . EYE SURGERY     Social History   Socioeconomic History  . Marital status: Single    Spouse name: Not on file  . Number of children: 0   . Years of education: Not on file  . Highest education level: Not on file  Occupational History  .  IT/software development  Social Needs  . Financial resource strain: Not on file  . Food insecurity:    Worry: Not on file    Inability: Not on file  . Transportation needs:    Medical: Not on file    Non-medical: Not on file  Tobacco Use  . Smoking status: Never Smoker  . Smokeless tobacco: Never Used  Substance and Sexual Activity  . Alcohol use: No  . Drug use: Never  . Sexual activity: Not Currently  Lifestyle  . Physical activity:    Days per week: Not on file    Minutes per session: Not on file  . Stress: Not on file  Relationships  . Social connections:    Talks on phone: Not on file    Gets together: Not on file    Attends religious service: Not on file    Active member of club or organization: Not on file    Attends meetings of clubs or organizations: Not on file    Relationship status: Not on file  . Intimate partner violence:    Fear of current or ex partner: Not on file    Emotionally abused: Not on file    Physically abused: Not on file    Forced sexual activity: Not on file  Other Topics Concern  . Not on file  Social History Narrative  . Not on file   Current Outpatient Medications on File Prior to Visit  Medication Sig Dispense Refill  . cetirizine (ZYRTEC) 10 MG tablet Take 1 tablet (10 mg total) daily by mouth. 30 tablet 11  . fluticasone (FLONASE) 50 MCG/ACT nasal spray Place 1 spray into both nostrils at bedtime. 16 g 1  . glucagon (GLUCAGON EMERGENCY) 1 MG injection Inject 1 mg into the muscle once as needed for up to 1 dose. 1 each 12  . glucose blood test strip 1 each by Other  route as needed for other. Use as instructed    . insulin lispro (HUMALOG) 100 UNIT/ML injection Inject 0.12-0.15 mLs (12-15 Units total) into the skin 2 (two) times daily before a meal. 40 mL 3  . insulin NPH Human (HUMULIN N,NOVOLIN N) 100 UNIT/ML injection Inject 15 Units 2 (two) times daily into the skin.    . montelukast (SINGULAIR) 10 MG tablet Take 1 tablet (10 mg total) at bedtime by mouth. 30 tablet 3  . omeprazole (PRILOSEC) 40 MG capsule Take 1 capsule (40 mg total) by mouth daily. 30 capsule 0   No current facility-administered  medications on file prior to visit.   No Known Allergies No family history on file.  PE: BP 130/80   Pulse 97   Ht 5' 9"  (1.753 m)   Wt 163 lb (73.9 kg)   SpO2 98%   BMI 24.07 kg/m  Wt Readings from Last 3 Encounters:  04/16/19 163 lb (73.9 kg)  12/11/18 159 lb (72.1 kg)  08/07/18 159 lb (72.1 kg)   Constitutional: Normal weight, in NAD Eyes: PERRLA, EOMI, no exophthalmos ENT: moist mucous membranes, no thyromegaly, no cervical lymphadenopathy Cardiovascular: tachycardia, RR, No MRG Respiratory: CTA B Gastrointestinal: abdomen soft, NT, ND, BS+ Musculoskeletal: no deformities, strength intact in all 4 Skin: moist, warm, no rashes Neurological: no tremor with outstretched hands, DTR normal in all 4  ASSESSMENT: 1. DM1, uncontrolled, with complications - PN - Diabetic foot ulcer - DR - History of seizures  PLAN:  1. Patient with longstanding, uncontrolled, type 1 diabetes, on basal/bolus insulin regimen.  Unfortunately, he does not have insurance and it is very difficult for him to afford a complex medication regimen.  Also, he cannot afford an insulin pump or CGM.  At last visit he mentioned that he could obtain Humalog from [facility] and I called this into his pharmacy.  I also advised him to check if an analog long-acting insulin would be covered.  These were not. -At last visit he was not checking sugars and I strongly advised him to  start.  I advised him to check 15 minutes before each meal and occasionally at bedtime.  I also advised him to inject the insulin 15 minutes before meals.  He was forgetting the second dose of NPH at last visit and we discussed about setting alarms on his phone to remember to take it.  I could not change his regimen at that time since I do not have any data to go by. -At this visit, he again does not check sugars.  We discussed that I cannot make any changes in his regimen or to help him in any way if he does not start checking sugars.  He is interested in a CGM but is not sure whether he can afford to pay out-of-pocket for it.  We discussed about the CGM is on the market and he will look into cost and let me know if he needs a prescription for it. -He mentions he is still forgetting the NPH at night occasionally.  He is developing an application that will admit a light to the ceiling to alert him to take his insulin -In the meantime, we checked his HbA1c: 8.6% (slightly higher), however, not knowing about how his sugars behave, I cannot make any changes in his regimen, again... -I suggested to: Patient Instructions  Please continue: - Humalog  12-15 units 15 min before meals  - NPH 15 units 2x a day  Start checking sugars 3-4 times a day, 15 minutes before meals and also occasionally at bedtime.  Please come back for a follow-up appointment in 3-4 months.  - advised to check sugars at different times of the day - 4x a day, rotating check times - advised for yearly eye exams >> he is UTD - return to clinic in 3-4 months  - time spent with the patient: 25 min, of which >50% was spent in reviewing his insulin doses, previous labs, discussing the absolute need to start checking his sugars and why this is important, reviewing CGM is on the market, discussing about timing of taking  insulin correctly, strategies to not forget NPH at night, and developing a plan to avoid hypo- and hyper-glycemia.    Jonathan Kingdom, MD PhD [facility] Endocrinology

## 2019-08-13 ENCOUNTER — Ambulatory Visit: Payer: Self-pay | Admitting: Internal Medicine

## 2019-08-13 ENCOUNTER — Encounter: Payer: Self-pay | Admitting: Internal Medicine

## 2019-08-13 ENCOUNTER — Other Ambulatory Visit: Payer: Self-pay

## 2019-08-13 VITALS — BP 108/70 | HR 105 | Ht 69.0 in | Wt 165.0 lb

## 2019-08-13 DIAGNOSIS — E1042 Type 1 diabetes mellitus with diabetic polyneuropathy: Secondary | ICD-10-CM

## 2019-08-13 LAB — POCT GLYCOSYLATED HEMOGLOBIN (HGB A1C): Hemoglobin A1C: 8.4 % — AB (ref 4.0–5.6)

## 2019-08-13 NOTE — Progress Notes (Signed)
Patient ID: Jonathan Armstrong, male   DOB: [birth year], 34 y.o.   MRN: [id number]   This visit occurred during the SARS-CoV-2 public health emergency.  Safety protocols were in place, including screening questions prior to the visit, additional usage of staff PPE, and extensive cleaning of exam room while observing appropriate contact time as indicated for disinfecting solutions.   HPI: Jonathan Armstrong is a 34 y.o.-year-old male, initially referred by his PCP, Trixie Dredge, PA-C , returning for f/u for DM1, dx 1998 (34 y/o), uncontrolled, with complications (peripheral neuropathy, diabetic foot ulcer, diabetic retinopathy, history of seizure x1 2/2 hypoglycemia). He moved to [place] in 2017 from [place]. Last OV 4 months ago.  He does not have insurance so he cannot afford an insulin pump and CGM.  Reviewed HbA1c levels: Lab Results  Component Value Date   HGBA1C 8.6 (A) 04/16/2019   HGBA1C 8.5 (A) 12/11/2018   HGBA1C 8.8 (A) 07/13/2018   Pt was on - ReliOn insulins: -NPH 15 units twice a day (tried to move second NPH dose at BT >> sugars higher in am) -Regular insulin/Humalog 12-15 units twice a day with B and D as he drops sugars if takes this with L.  Now on: - NPH 15 units 2x a day, last dose at bedtime (still occasionally forgets the night dose) - Humalog 12-15 units 15 min before meals - now affordable  Meter: AccuCheck  At last visit he was not checking sugars.  Now checking 0-1 times a day: - am: 120-170 >> 180-185 - 2h after b'fast: n/c - before lunch: n/c - 2h after lunch: n/c - before dinner: 80-120 >> 100 - 2h after dinner: n/c - bedtime: n/c - nighttime: n/c + lows. Lowest sugar was 17 (!) >> seizure 2018; 35 - seldom - between 10 pm -1 am >> 60 >> ? >> 60; he has hypoglycemia awareness in the 70s.  No previous hypoglycemia admission.  He cannot afford a glucagon kit! Highest sugar was 670 long time ago; last few mo: 360 (infection) >> 312 >> ? >> 310  (forgot to take insulin).  No previous DKA admission.    Pt's meals are: - Breakfast: 2/3 bowl oatmeal, butter, salt (12-15 units)  - Lunch: PBJ sandwich or a bowl of noodles (0 units) - Dinner: meat + veggies+ starch (45-60 CHO) - Snacks: crackers She saw nutrition 2006-2008.  -No CKD, last BUN/creatinine:  Lab Results  Component Value Date   BUN 15 12/11/2018   BUN 14 07/13/2018   CREATININE 1.09 12/11/2018   CREATININE 0.84 07/13/2018  + Urinary infections after dx >> resolved.  -No HL: last set of lipids: Lab Results  Component Value Date   CHOL 186 12/11/2018   HDL 70.00 12/11/2018   LDLCALC 99 12/11/2018   TRIG 86.0 12/11/2018   CHOLHDL 3 12/11/2018    - last eye exam was in 06/2018: + DR. He has intraocular injections. Dr. Jalene Mullet (who also his boss).  He had surgery for removal of retinal scarring 2018.  -+ Numbness in feet  Latest TSH was normal: Lab Results  Component Value Date   TSH 0.70 12/11/2018   Pt has FH of DM1 in Paternal Great uncle. MGF and MGM had DM2.  He would not want to have children to avoid the risk of getting type 1 diabetes.  ROS: Constitutional: no weight gain/no weight loss, no fatigue, no subjective hyperthermia, no subjective hypothermia Eyes: no blurry vision, no xerophthalmia ENT: no sore throat,  no nodules palpated in neck, no dysphagia, no odynophagia, no hoarseness Cardiovascular: no CP/no SOB/no palpitations/no leg swelling Respiratory: no cough/no SOB/no wheezing Gastrointestinal: no N/no V/no D/no C/no acid reflux Musculoskeletal: no muscle aches/no joint aches Skin: no rashes, no hair loss Neurological: no tremors/+ numbness/no tingling/no dizziness, + migraines  I reviewed pt's medications, allergies, PMH, social hx, family hx, and changes were documented in the history of present illness. Otherwise, unchanged from my initial visit note.  Past Medical History:  Diagnosis Date  . Chronic cough   . Diabetic  retinopathy associated with type 1 diabetes mellitus ([place]) 02/14/2017  . Seizure ([place])   . Type 1 diabetes mellitus ([place])    Past Surgical History:  Procedure Laterality Date  . EYE SURGERY     Social History   Socioeconomic History  . Marital status: Single    Spouse name: Not on file  . Number of children: 0   . Years of education: Not on file  . Highest education level: Not on file  Occupational History  .  IT/software development  Social Needs  . Financial resource strain: Not on file  . Food insecurity:    Worry: Not on file    Inability: Not on file  . Transportation needs:    Medical: Not on file    Non-medical: Not on file  Tobacco Use  . Smoking status: Never Smoker  . Smokeless tobacco: Never Used  Substance and Sexual Activity  . Alcohol use: No  . Drug use: Never  . Sexual activity: Not Currently  Lifestyle  . Physical activity:    Days per week: Not on file    Minutes per session: Not on file  . Stress: Not on file  Relationships  . Social connections:    Talks on phone: Not on file    Gets together: Not on file    Attends religious service: Not on file    Active member of club or organization: Not on file    Attends meetings of clubs or organizations: Not on file    Relationship status: Not on file  . Intimate partner violence:    Fear of current or ex partner: Not on file    Emotionally abused: Not on file    Physically abused: Not on file    Forced sexual activity: Not on file  Other Topics Concern  . Not on file  Social History Narrative  . Not on file   Current Outpatient Medications on File Prior to Visit  Medication Sig Dispense Refill  . cetirizine (ZYRTEC) 10 MG tablet Take 1 tablet (10 mg total) daily by mouth. 30 tablet 11  . fluticasone (FLONASE) 50 MCG/ACT nasal spray Place 1 spray into both nostrils at bedtime. 16 g 1  . glucagon (GLUCAGON EMERGENCY) 1 MG injection Inject 1 mg into the muscle once as needed for up to 1 dose. 1  each 12  . glucose blood test strip 1 each by Other route as needed for other. Use as instructed    . insulin lispro (HUMALOG) 100 UNIT/ML injection Inject 0.12-0.15 mLs (12-15 Units total) into the skin 2 (two) times daily before a meal. 40 mL 3  . insulin NPH Human (HUMULIN N,NOVOLIN N) 100 UNIT/ML injection Inject 15 Units 2 (two) times daily into the skin.    . montelukast (SINGULAIR) 10 MG tablet Take 1 tablet (10 mg total) at bedtime by mouth. 30 tablet 3  . omeprazole (PRILOSEC) 40 MG capsule Take 1 capsule (  40 mg total) by mouth daily. 30 capsule 0   No current facility-administered medications on file prior to visit.   No Known Allergies No family history on file.  PE: BP 108/70   Pulse (!) 105   Ht _0  (1.753 m)   Wt 165 lb (74.8 kg)   SpO2 98%   BMI 24.37 kg/m  Wt Readings from Last 3 Encounters:  08/13/19 165 lb (74.8 kg)  04/16/19 163 lb (73.9 kg)  12/11/18 159 lb (72.1 kg)   Constitutional: normal weight, in NAD Eyes: PERRLA, EOMI, no exophthalmos ENT: moist mucous membranes, no thyromegaly, no cervical lymphadenopathy Cardiovascular: Tachycardia, RR, No MRG Respiratory: CTA B Gastrointestinal: abdomen soft, NT, ND, BS+ Musculoskeletal: no deformities, strength intact in all 4 Skin: moist, warm, no rashes Neurological: no tremor with outstretched hands, DTR normal in all 4  ASSESSMENT: 1. DM1, uncontrolled, with complications - PN - Diabetic foot ulcer - DR - History of seizures  PLAN:  1. Patient with longstanding, uncontrolled, type 1 diabetes, on basal-bolus insulin regimen.  Unfortunately, he does not have insurance and it is very difficult for him to afford a complex medication regimen.  Also, he cannot afford an insulin pump or CGM.  He finally did obtain Humalog from [facility] before last visit.  We are using NPH for him due to price. -At last visit, he was not checking sugars and I strongly advised him to start.  I also advised him to check 15  minutes before each meal and occasionally at bedtime and bolus Humalog before meals.  We discussed that I cannot change his regimen unless he starts checking sugars.  At last visit HbA1c was slightly higher, at 8.6%. -At this visit, sugars are above target in the morning but they are usually at goal before dinner.  He does not usually check at other times of the day as he is dizzy.  We discussed at this visit about the fact that ideally, we would use a longer acting insulin.  However, he checked with his insurance and these are not covered.  Therefore, we need to stay with NPH insulin.  I suggested redistributing his NPH insulin to have a higher dose in the evening and the lower dose in the morning.  However, he feels that his sugars during the day are controlled and would want to keep the a.m. dose of NPH the same for now.  I advised him to increase the evening dose by 3 units for now, but he may need even a higher dose. -I suggested to: Patient Instructions  Please continue: - Humalog  12-15 units 15 min before meals   Please increase: - NPH 15 units in am and 18 units at bedtime  Check sugars 3 times a day, 15 minutes before meals and also occasionally at bedtime.  Please come back for a follow-up appointment in 3-4 months.  - we checked his HbA1c: 8.4% (only slightly lower) - advised to check sugars at different times of the day - 3x a day, rotating check times - advised for yearly eye exams >> he is UTD - return to clinic in 4 months   Philemon Kingdom, MD PhD [facility] Endocrinology

## 2019-08-13 NOTE — Addendum Note (Signed)
Addended by: Darliss Ridgel I on: 08/13/2019 11:09 AM   Modules accepted: Orders

## 2019-08-13 NOTE — Patient Instructions (Addendum)
Please continue: - Humalog  12-15 units 15 min before meals   Please increase: - NPH 15 units in am and 18 units at bedtime  Check sugars 3 times a day, 15 minutes before meals and also occasionally at bedtime.  Please come back for a follow-up appointment in 3-4 months.

## 2019-12-17 ENCOUNTER — Encounter: Payer: Self-pay | Admitting: Internal Medicine

## 2019-12-17 ENCOUNTER — Other Ambulatory Visit: Payer: Self-pay

## 2019-12-17 ENCOUNTER — Ambulatory Visit: Payer: Self-pay | Admitting: Internal Medicine

## 2019-12-17 VITALS — BP 110/70 | HR 98 | Ht 69.0 in | Wt 165.0 lb

## 2019-12-17 DIAGNOSIS — E1042 Type 1 diabetes mellitus with diabetic polyneuropathy: Secondary | ICD-10-CM

## 2019-12-17 LAB — POCT GLYCOSYLATED HEMOGLOBIN (HGB A1C): Hemoglobin A1C: 8.5 % — AB (ref 4.0–5.6)

## 2019-12-17 NOTE — Addendum Note (Signed)
Addended by: Darliss Ridgel I on: 12/17/2019 11:18 AM   Modules accepted: Orders

## 2019-12-17 NOTE — Progress Notes (Signed)
Patient ID: Jonathan Armstrong, male   DOB: [birth year], 34 y.o.   MRN: [id number]   This visit occurred during the SARS-CoV-2 public health emergency.  Safety protocols were in place, including screening questions prior to the visit, additional usage of staff PPE, and extensive cleaning of exam room while observing appropriate contact time as indicated for disinfecting solutions.   HPI: Jonathan Armstrong is a 34 y.o.-year-old male, initially referred by his PCP, No primary care provider on file. , returning for f/u for DM1, dx 3 (34 y/o), uncontrolled, with complications (peripheral neuropathy, diabetic foot ulcer, diabetic retinopathy, history of seizure x1 2/2 hypoglycemia). He moved to [place] in 2017 from [place]. Last OV 4 months ago.  He does not have insurance so he cannot afford an insulin pump and CGM.  However, he may start working full-time soon and get insurance at that time.  He was traveling recently and did not pay as much attention and checking his blood sugars as before.  Reviewed HbA1c levels: Lab Results  Component Value Date   HGBA1C 8.4 (A) 08/13/2019   HGBA1C 8.6 (A) 04/16/2019   HGBA1C 8.5 (A) 12/11/2018   Pt was on - ReliOn insulins: -NPH 15 units twice a day (tried to move second NPH dose at BT >> sugars higher in am) -Regular insulin/Humalog 12-15 units twice a day with B and D as he drops sugars if takes this with L.  Now on: - Humalog 12-15 units 15 min before meals  - NPH 15 units in am and 15 >> 18 >> 14 units at bedtime  Meter: AccuCheck  Is checking sugars 0-1 times a day: - am: 120-170 >> 180-185 >> 160-170s - 2h after b'fast: n/c - before lunch: n/c - 2h after lunch: n/c - before dinner: 80-120 >> 100 >> 120-150s - 2h after dinner: n/c - bedtime: n/c - nighttime: n/c + lows. Lowest sugar was 17 (!) >> seizure 2018; 37 - seldom - between 10 pm -1 am >> .Marland Kitchen. 60 >> 55 (at night before decreasing NPH dose, during the day - at work); he has hypoglycemia  awareness in the 23s.  No previous hypoglycemia admission.  He cannot afford a glucagon kit! Highest sugar was 670 long time ago; last few mo: 360 (infection) >> ...310 (forgot to take insulin) >> 297 (traveling).  No previous DKA admission.    Pt's meals are: - Breakfast: 2/3 bowl oatmeal, butter, salt (12-15 units)  - Lunch: PBJ sandwich or a bowl of noodles (0 units) - Dinner: meat + veggies+ starch (45-60 CHO) - Snacks: crackers She saw nutrition 2006-2008.  -No CKD, last BUN/creatinine:  Lab Results  Component Value Date   BUN 15 12/11/2018   BUN 14 07/13/2018   CREATININE 1.09 12/11/2018   CREATININE 0.84 07/13/2018  + Urinary infections after dx >> resolved.  -No HL; last set of lipids: Lab Results  Component Value Date   CHOL 186 12/11/2018   HDL 70.00 12/11/2018   LDLCALC 99 12/11/2018   TRIG 86.0 12/11/2018   CHOLHDL 3 12/11/2018    - last eye exam was in 2021: + DR. He gets intraocular injections.  Dr. Jalene Mullet (who also his boss).  He had surgery for removal of retinal scarring 2018.  -He has numbness in feet  Latest TSH was normal: Lab Results  Component Value Date   TSH 0.70 12/11/2018   Pt has FH of DM1 in Paternal Great uncle. MGF and MGM had DM2.  He would not  want to have children to avoid the risk of getting type 1 diabetes.  ROS: Constitutional: no weight gain/no weight loss, no fatigue, no subjective hyperthermia, no subjective hypothermia Eyes: no blurry vision, no xerophthalmia ENT: no sore throat, no nodules palpated in neck, no dysphagia, no odynophagia, no hoarseness Cardiovascular: no CP/no SOB/no palpitations/no leg swelling Respiratory: no cough/no SOB/no wheezing Gastrointestinal: no N/no V/no D/no C/no acid reflux Musculoskeletal: no muscle aches/no joint aches Skin: no rashes, no hair loss Neurological: no tremors/+ numbness/no tingling/no dizziness, + HAs  I reviewed pt's medications, allergies, PMH, social hx, family hx, and  changes were documented in the history of present illness. Otherwise, unchanged from my initial visit note.  Past Medical History:  Diagnosis Date  . Chronic cough   . Diabetic retinopathy associated with type 1 diabetes mellitus ([place]) 02/14/2017  . Seizure ([place])   . Type 1 diabetes mellitus ([place])    Past Surgical History:  Procedure Laterality Date  . EYE SURGERY     Social History   Socioeconomic History  . Marital status: Single    Spouse name: Not on file  . Number of children: 0   . Years of education: Not on file  . Highest education level: Not on file  Occupational History  .  IT/software development  Social Needs  . Financial resource strain: Not on file  . Food insecurity:    Worry: Not on file    Inability: Not on file  . Transportation needs:    Medical: Not on file    Non-medical: Not on file  Tobacco Use  . Smoking status: Never Smoker  . Smokeless tobacco: Never Used  Substance and Sexual Activity  . Alcohol use: No  . Drug use: Never  . Sexual activity: Not Currently  Lifestyle  . Physical activity:    Days per week: Not on file    Minutes per session: Not on file  . Stress: Not on file  Relationships  . Social connections:    Talks on phone: Not on file    Gets together: Not on file    Attends religious service: Not on file    Active member of club or organization: Not on file    Attends meetings of clubs or organizations: Not on file    Relationship status: Not on file  . Intimate partner violence:    Fear of current or ex partner: Not on file    Emotionally abused: Not on file    Physically abused: Not on file    Forced sexual activity: Not on file  Other Topics Concern  . Not on file  Social History Narrative  . Not on file   Current Outpatient Medications on File Prior to Visit  Medication Sig Dispense Refill  . cetirizine (ZYRTEC) 10 MG tablet Take 1 tablet (10 mg total) daily by mouth. 30 tablet 11  . fluticasone (FLONASE) 50  MCG/ACT nasal spray Place 1 spray into both nostrils at bedtime. 16 g 1  . glucagon (GLUCAGON EMERGENCY) 1 MG injection Inject 1 mg into the muscle once as needed for up to 1 dose. 1 each 12  . glucose blood test strip 1 each by Other route as needed for other. Use as instructed    . insulin lispro (HUMALOG) 100 UNIT/ML injection Inject 0.12-0.15 mLs (12-15 Units total) into the skin 2 (two) times daily before a meal. 40 mL 3  . insulin NPH Human (HUMULIN N,NOVOLIN N) 100 UNIT/ML injection Inject 15 Units  2 (two) times daily into the skin.    . montelukast (SINGULAIR) 10 MG tablet Take 1 tablet (10 mg total) at bedtime by mouth. 30 tablet 3  . omeprazole (PRILOSEC) 40 MG capsule Take 1 capsule (40 mg total) by mouth daily. 30 capsule 0   No current facility-administered medications on file prior to visit.   No Known Allergies No family history on file.  PE: BP 110/70   Pulse 98   Ht 5' 9" (1.753 m)   Wt 165 lb (74.8 kg)   SpO2 97%   BMI 24.37 kg/m  Wt Readings from Last 3 Encounters:  12/17/19 165 lb (74.8 kg)  08/13/19 165 lb (74.8 kg)  04/16/19 163 lb (73.9 kg)   Constitutional: normal weight, in NAD Eyes: PERRLA, EOMI, no exophthalmos ENT: moist mucous membranes, no thyromegaly, no cervical lymphadenopathy Cardiovascular: tachycardia, RR, No MRG Respiratory: CTA B Gastrointestinal: abdomen soft, NT, ND, BS+ Musculoskeletal: no deformities, strength intact in all 4 Skin: moist, warm, no rashes Neurological: no tremor with outstretched hands, DTR normal in all 4  ASSESSMENT: 1. DM1, uncontrolled, with complications - PN - Diabetic foot ulcer - DR - History of seizures  PLAN:  1. Patient with longstanding, uncontrolled, type 1 diabetes, on basal/bolus insulin regimen.  Unfortunately, he does not have insurance and it is very difficult for him to afford a complex medication regimen.  Also, he cannot afford an insulin pump or CGM.  She finally did obtain Humalog from  [facility] at the beginning of this year.  We are using NPH for him due to price.  In the past, he was not checking sugars but before last visit he started to check occasionally.  They were above target in the morning and usually at goal before dinner.  He did not check at other times as he was too busy.  We discussed about the fact that ideally we would use a longer acting insulin but he checked with his insurance and it were not covered.  Therefore, we continue the NPH insulin.  I did suggest to increase his evening dose of NPH at last visit -At this visit, however, he tells me that he decrease the dose of NPH even lower than the one at last visit, since he had a low blood sugar in the night.  Currently, his blood sugars are still very high in the morning and we discussed about increasing the dose of NPH at night by 2 units.  Alternatively, I did suggest the new bio similar glargine insulin: Semglee, which he thinks he may be able to afford.  He will look on good Rx. -He is still not checking sugars in the middle of the day even in the days that he is off from work and also not checking at all after dinner.  I advised him that without checking after dinner at bedtime, it is unclear if his high blood sugars in the morning are due to high blood sugars after dinner or an increase in blood sugars overnight.  I strongly advised him to start doing so. -We also discussed about the need to get a CGM and he may be able to afford this after he gets insurance -I suggested to: Patient Instructions  Please continue: - Humalog  12-15 units 15 min before meals   Please increase: - NPH 15 units in am and 16 units at bedtime  Please look up Semglee (glargine).  Check sugars 3 times a day, 15 minutes before meals and also  occasionally at bedtime.  Please come back for a follow-up appointment in 3-4 months.  - we checked his HbA1c: 8.5% (slightly higher) - advised to check sugars at different times of the day -  3-4x a day, rotating check times - advised for yearly eye exams >> he is UTD - We discussed about getting annual labs today-but will wait until next visit per his preference - return to clinic in 3-4 months  - Total time spent for the visit: 25 minutes, in obtaining medical information from the chart, reviewing his  previous labs, evaluations, and treatments, reviewing his blood sugars, counseling him about his condition (please see the discussed topics above), and developing a plan to further investigate and treat it.  Philemon Kingdom, MD PhD [facility] Endocrinology

## 2019-12-17 NOTE — Patient Instructions (Signed)
Please continue: - Humalog  12-15 units 15 min before meals   Please increase: - NPH 15 units in am and 16 units at bedtime  Please look up Semglee (glargine).  Check sugars 3 times a day, 15 minutes before meals and also occasionally at bedtime.  Please come back for a follow-up appointment in 3-4 months.

## 2020-01-07 ENCOUNTER — Other Ambulatory Visit: Payer: Self-pay | Admitting: Internal Medicine

## 2020-04-28 ENCOUNTER — Ambulatory Visit (INDEPENDENT_AMBULATORY_CARE_PROVIDER_SITE_OTHER): Payer: Self-pay | Admitting: Internal Medicine

## 2020-04-28 ENCOUNTER — Other Ambulatory Visit: Payer: Self-pay

## 2020-04-28 ENCOUNTER — Encounter: Payer: Self-pay | Admitting: Internal Medicine

## 2020-04-28 VITALS — BP 140/88 | HR 99 | Ht 69.0 in | Wt 169.8 lb

## 2020-04-28 DIAGNOSIS — E1042 Type 1 diabetes mellitus with diabetic polyneuropathy: Secondary | ICD-10-CM

## 2020-04-28 LAB — LIPID PANEL
Cholesterol: 177 mg/dL (ref 0–200)
HDL: 63.9 mg/dL (ref >39.00)
LDL Cholesterol: 98 mg/dL (ref 0–99)
NonHDL: 112.75
Total CHOL/HDL Ratio: 3
Triglycerides: 74 mg/dL (ref 0.0–149.0)
VLDL: 14.8 mg/dL (ref 0.0–40.0)

## 2020-04-28 LAB — POCT GLYCOSYLATED HEMOGLOBIN (HGB A1C): Hemoglobin A1C: 8.5 % — AB (ref 4.0–5.6)

## 2020-04-28 LAB — COMPREHENSIVE METABOLIC PANEL
ALT: 36 U/L (ref 0–53)
AST: 25 U/L (ref 0–37)
Albumin: 4.4 g/dL (ref 3.5–5.2)
Alkaline Phosphatase: 80 U/L (ref 39–117)
BUN: 14 mg/dL (ref 6–23)
CO2: 33 mEq/L — ABNORMAL HIGH (ref 19–32)
Calcium: 9.7 mg/dL (ref 8.4–10.5)
Chloride: 103 mEq/L (ref 96–112)
Creatinine, Ser: 0.92 mg/dL (ref 0.40–1.50)
GFR: 107.3 mL/min (ref >60.00)
Glucose, Bld: 45 mg/dL — CL (ref 70–99)
Potassium: 4 mEq/L (ref 3.5–5.1)
Sodium: 141 mEq/L (ref 135–145)
Total Bilirubin: 0.4 mg/dL (ref 0.2–1.2)
Total Protein: 7.8 g/dL (ref 6.0–8.3)

## 2020-04-28 LAB — TSH: TSH: 1.38 u[IU]/mL (ref 0.35–4.50)

## 2020-04-28 NOTE — Progress Notes (Signed)
Patient ID: Jonathan Armstrong, male   DOB: [birth year], 35 y.o.   MRN: [id number]   This visit occurred during the SARS-CoV-2 public health emergency.  Safety protocols were in place, including screening questions prior to the visit, additional usage of staff PPE, and extensive cleaning of exam room while observing appropriate contact time as indicated for disinfecting solutions.   HPI: Jonathan Armstrong is a 35 y.o.-year-old male, initially referred by his PCP, Patient, [name] , returning for f/u for DM1, dx 10 (35 y/o), uncontrolled, with complications (peripheral neuropathy, diabetic foot ulcer, diabetic retinopathy, history of seizure x1 2/2 hypoglycemia). He moved to [place] in 2017 from [place]. Last OV 4 months ago.  He does not have insurance so he cannot afford long-acting insulin analogs, a CGM, or an insulin pump.  He may start working full-time in the near future and will get insurance then -this is planned for 6 mo-1 year for now, when the new office is ready.  Reviewed HbA1c levels: Lab Results  Component Value Date   HGBA1C 8.5 (A) 12/17/2019   HGBA1C 8.4 (A) 08/13/2019   HGBA1C 8.6 (A) 04/16/2019   Pt was on - ReliOn insulins: -NPH 15 units twice a day (tried to move second NPH dose at BT >> sugars higher in am) -Regular insulin/Humalog 12-15 units twice a day with B and D as he drops sugars if takes this with L.  Now on: - Humalog 12-15 units 15 min before meals  - NPH 15 units in am and 15 >> 18 >> 16 >> now 18-20 units at bedtime  Meter: AccuCheck  Is checking sugars 0-2x a day: - am: 120-170 >> 180-185 >> 160-170s >> 160-170 - 2h after b'fast: n/c - before lunch: n/c - 2h after lunch: n/c - before dinner: 80-120 >> 100 >> 120-150s >> 100-120 - 2h after dinner: n/c >> n/c - bedtime: n/c >> 150-160 - nighttime: n/c He does have lows.  Lowest sugar was 37 (!) >> seizure 2018; 37 >> .Marland Kitchen. 60 >> 55 >> 46 (during the day); he has hypoglycemia awareness in the 70s.   No  previous hypoglycemia admission  !He cannot afford a glucagon kit! Highest sugar was 670 long time ago; more recently: 310 (forgot to take insulin) >> 297 (traveling).  No previous DKA admissions.    Pt's meals are: - Breakfast: 2/3 bowl oatmeal, butter, salt (12-15 units)  - Lunch: PBJ sandwich or a bowl of noodles (0 units) - Dinner: meat + veggies+ starch (45-60 CHO) - Snacks: crackers She saw nutrition 2006-2008.  -No CKD, last BUN/creatinine:  Lab Results  Component Value Date   BUN 15 12/11/2018   BUN 14 07/13/2018   CREATININE 1.09 12/11/2018   CREATININE 0.84 07/13/2018  He had urinary tract infections after his diabetes diagnosis, but these resolved.  -No HL; last set of lipids: Lab Results  Component Value Date   CHOL 186 12/11/2018   HDL 70.00 12/11/2018   LDLCALC 99 12/11/2018   TRIG 86.0 12/11/2018   CHOLHDL 3 12/11/2018    - last eye exam was in 2021: + DR.  He gets intraocular injections.  Dr. Jalene Mullet (who also his boss).  He had surgery for removal of retinal scarring 2018.  -+ Numbness in feet  Latest TSH was reviewed: Lab Results  Component Value Date   TSH 0.70 12/11/2018   Pt has FH of DM1 in Paternal Great uncle. MGF and MGM had DM2.  He would not want  to have children to avoid the risk of getting type 1 diabetes.  ROS: Constitutional: no weight gain/no weight loss, no fatigue, no subjective hyperthermia, no subjective hypothermia Eyes: no blurry vision, no xerophthalmia ENT: no sore throat, no nodules palpated in neck, no dysphagia, no odynophagia, no hoarseness Cardiovascular: no CP/no SOB/no palpitations/no leg swelling Respiratory: no cough/no SOB/no wheezing Gastrointestinal: no N/no V/no D/no C/no acid reflux Musculoskeletal: no muscle aches/no joint aches Skin: no rashes, no hair loss Neurological: no tremors/+ numbness/no tingling/no dizziness  I reviewed pt's medications, allergies, PMH, social hx, family hx, and changes were  documented in the history of present illness. Otherwise, unchanged from my initial visit note.  Past Medical History:  Diagnosis Date  . Chronic cough   . Diabetic retinopathy associated with type 1 diabetes mellitus ([place]) 02/14/2017  . Seizure ([place])   . Type 1 diabetes mellitus ([place])    Past Surgical History:  Procedure Laterality Date  . EYE SURGERY     Social History   Socioeconomic History  . Marital status: Single    Spouse name: Not on file  . Number of children: 0   . Years of education: Not on file  . Highest education level: Not on file  Occupational History  .  IT/software development  Social Needs  . Financial resource strain: Not on file  . Food insecurity:    Worry: Not on file    Inability: Not on file  . Transportation needs:    Medical: Not on file    Non-medical: Not on file  Tobacco Use  . Smoking status: Never Smoker  . Smokeless tobacco: Never Used  Substance and Sexual Activity  . Alcohol use: No  . Drug use: Never  . Sexual activity: Not Currently  Lifestyle  . Physical activity:    Days per week: Not on file    Minutes per session: Not on file  . Stress: Not on file  Relationships  . Social connections:    Talks on phone: Not on file    Gets together: Not on file    Attends religious service: Not on file    Active member of club or organization: Not on file    Attends meetings of clubs or organizations: Not on file    Relationship status: Not on file  . Intimate partner violence:    Fear of current or ex partner: Not on file    Emotionally abused: Not on file    Physically abused: Not on file    Forced sexual activity: Not on file  Other Topics Concern  . Not on file  Social History Narrative  . Not on file   Current Outpatient Medications on File Prior to Visit  Medication Sig Dispense Refill  . cetirizine (ZYRTEC) 10 MG tablet Take 1 tablet (10 mg total) daily by mouth. 30 tablet 11  . fluticasone (FLONASE) 50 MCG/ACT nasal  spray Place 1 spray into both nostrils at bedtime. 16 g 1  . glucagon (GLUCAGON EMERGENCY) 1 MG injection Inject 1 mg into the muscle once as needed for up to 1 dose. 1 each 12  . glucose blood test strip 1 each by Other route as needed for other. Use as instructed    . insulin lispro (HUMALOG) 100 UNIT/ML injection INJECT 12 TO 15 UNITS UNDER THE SKIN TWICE DAILY BEFORE MEALS 40 mL 3  . insulin NPH Human (HUMULIN N,NOVOLIN N) 100 UNIT/ML injection Inject 15 Units 2 (two) times daily into the skin.    Marland Kitchen  montelukast (SINGULAIR) 10 MG tablet Take 1 tablet (10 mg total) at bedtime by mouth. 30 tablet 3  . omeprazole (PRILOSEC) 40 MG capsule Take 1 capsule (40 mg total) by mouth daily. 30 capsule 0   No current facility-administered medications on file prior to visit.   No Known Allergies No family history on file.  PE: BP 140/88   Pulse 99   Ht 5' 9"  (1.753 m)   Wt 169 lb 12.8 oz (77 kg)   SpO2 99%   BMI 25.08 kg/m  Wt Readings from Last 3 Encounters:  04/28/20 169 lb 12.8 oz (77 kg)  12/17/19 165 lb (74.8 kg)  08/13/19 165 lb (74.8 kg)   Constitutional: normal weight, in NAD Eyes: PERRLA, EOMI, no exophthalmos ENT: moist mucous membranes, no thyromegaly, no cervical lymphadenopathy Cardiovascular: Tachycardia, RR, No MRG Respiratory: CTA B Gastrointestinal: abdomen soft, NT, ND, BS+ Musculoskeletal: no deformities, strength intact in all 4 Skin: moist, warm, no rashes Neurological: no tremor with outstretched hands, DTR normal in all 4  ASSESSMENT: 1. DM1, uncontrolled, with complications - PN - Diabetic foot ulcer - DR - History of seizures  PLAN:  1. Patient with longstanding, uncontrolled, type 1 diabetes, on basal-bolus insulin regimen.  Unfortunately, he does not have insurance and cannot afford a complex medication regimen.  We have him on Humalog and also NPH vials, which he obtains without prescription, from the pharmacy.  None at the analog long-acting insulin  alternatives are affordable for him. -At last visit, his sugars were still high in the morning but he had occasional lower blood sugars at night so I advised him to decrease the NPH at night.  Since then, however, he started to take his NPH closer to bedtime, which is around midnight and he noticed that after doing so, sugars did not decrease overnight so he was able to increase the NPH dose slightly.  At this visit, his sugars remain high in the morning we discussed that if he is taking NPH around midnight, we can even increase the dose slightly.  In the meantime, when he is active, he appears to drop his sugars during the day so I advised him to decrease the NPH in the morning.  Sugars at bedtime are at goal, and he does not check his blood sugars consistently during the day, so for now, we will continue the same doses of Humalog. -As soon as he gets insurance, we will need to start him on the CGM and switch NPH to an analog insulin.  However, at today's visit, he tells me that this may be 6 months to 1 year away. -I also discussed with him about establishing care with a PCP, but he would like to wait until he has insurance before doing so. -I suggested to: Patient Instructions  Please change: - NPH 12 units in am and 20-22 units at night  Continue: - Humalog 12-15 units before meals  Please stop at the lab.  Please come back for a follow-up appointment in 3-4 months.  - we checked his HbA1c: 8.5% (stable) - advised to check sugars at different times of the day - 4x a day, rotating check times - advised for yearly eye exams >> he is UTD - he is due for annual labs -he declined these at last visit -we will check them today - return to clinic in 3-4 months  - Total time spent for the visit: 25 min, in obtaining medical information from the pt. And from the  chart, reviewing his  previous labs, imaging evaluations, and treatments, reviewing his symptoms, counseling him about his condition (please  see the discussed topics above), and developing a plan to further investigate and treat it; he had a number of questions which I addressed.  Component     Latest Ref Rng & Units 04/28/2020  Sodium     135 - 145 mEq/L 141  Potassium     3.5 - 5.1 mEq/L 4.0  Chloride     96 - 112 mEq/L 103  CO2     19 - 32 mEq/L 33 (H)  Glucose     70 - 99 mg/dL 45 Repeated and verified X2. (LL)  BUN     6 - 23 mg/dL 14  Creatinine     0.40 - 1.50 mg/dL 0.92  Total Bilirubin     0.2 - 1.2 mg/dL 0.4  Alkaline Phosphatase     39 - 117 U/L 80  AST     0 - 37 U/L 25  ALT     0 - 53 U/L 36  Total Protein     6.0 - 8.3 g/dL 7.8  Albumin     3.5 - 5.2 g/dL 4.4  GFR     >60.00 mL/min 107.30  Calcium     8.4 - 10.5 mg/dL 9.7  Cholesterol     0 - 200 mg/dL 177  Triglycerides     0.0 - 149.0 mg/dL 74.0  HDL Cholesterol     >39.00 mg/dL 63.90  VLDL     0.0 - 40.0 mg/dL 14.8  LDL (calc)     0 - 99 mg/dL 98  Total CHOL/HDL Ratio      3  NonHDL      112.75  TSH     0.35 - 4.50 uIU/mL 1.38  Hemoglobin A1C     4.0 - 5.6 % 8.5 (A)   Glu was low, OTW labs OK.  We called as the patient comes in the visit he is feeling well.  We will discuss with patient that he may need lower doses of Humalog before breakfast and lunch if he is active during the day.  Philemon Kingdom, MD PhD [facility] Endocrinology

## 2020-04-28 NOTE — Patient Instructions (Signed)
Please change: - NPH 12 units in am and 20-22 units at night  Continue: - Humalog 12-15 units before meals  Please stop at the lab.  Please come back for a follow-up appointment in 3-4 months.

## 2020-04-29 ENCOUNTER — Telehealth: Payer: Self-pay | Admitting: Internal Medicine

## 2020-04-29 NOTE — Telephone Encounter (Signed)
Called pt back and advised him to monitor his sugar during the day and he may need to lower his breakfast and lunch Humalog insulin if he is active during the day. Pt verbalized understanding.

## 2020-04-29 NOTE — Telephone Encounter (Signed)
Dr Everardo All spoke with lab regarding critical results. Any further follow up required?

## 2020-04-29 NOTE — Telephone Encounter (Signed)
Hope with [facility] called on the after hours phone stating she had a critical for this patient. Please advise.

## 2020-04-29 NOTE — Telephone Encounter (Signed)
I called and spoke with the pt. He advised he is feeling great. He was feeling well yesterday as well. He asked why we were calling him to ask.

## 2020-04-29 NOTE — Telephone Encounter (Signed)
T, you did explain that this was good hypoglycemia on the BMP, right?

## 2020-04-29 NOTE — Telephone Encounter (Signed)
T, can you please give him a call to see if he is ok.  Was this an isolated incident?

## 2020-09-01 ENCOUNTER — Encounter: Payer: Self-pay | Admitting: Internal Medicine

## 2020-09-01 ENCOUNTER — Ambulatory Visit: Payer: Self-pay | Admitting: Internal Medicine

## 2020-09-01 ENCOUNTER — Other Ambulatory Visit: Payer: Self-pay

## 2020-09-01 VITALS — BP 128/88 | HR 96 | Ht 69.0 in | Wt 176.0 lb

## 2020-09-01 DIAGNOSIS — E1042 Type 1 diabetes mellitus with diabetic polyneuropathy: Secondary | ICD-10-CM

## 2020-09-01 LAB — POCT GLYCOSYLATED HEMOGLOBIN (HGB A1C): Hemoglobin A1C: 7.3 % — AB (ref 4.0–5.6)

## 2020-09-01 NOTE — Patient Instructions (Signed)
Please continue: - NPH 12 units in am and 20 units at night - Humalog 12-15 units before meals  Please come back for a follow-up appointment in 4 months.

## 2020-09-01 NOTE — Addendum Note (Signed)
Addended by: Kenyon Ana on: 09/01/2020 10:50 AM   Modules accepted: Orders

## 2020-09-01 NOTE — Progress Notes (Signed)
Patient ID: Jonathan Armstrong, male   DOB: 1986-03-11, 35 y.o.   MRN: [id number]   This visit occurred during the SARS-CoV-2 public health emergency.  Safety protocols were in place, including screening questions prior to the visit, additional usage of staff PPE, and extensive cleaning of exam room while observing appropriate contact time as indicated for disinfecting solutions.   HPI: Jonathan Armstrong is a 35 y.o.-year-old male, initially referred by his PCP, Patient, [name] (Inactive) , returning for f/u for DM1, dx 72 (35 y/o), uncontrolled, with complications (peripheral neuropathy, diabetic foot ulcer, diabetic retinopathy, history of seizure x1 2/2 hypoglycemia). He moved to [place] in 2017 from [place]. Last OV 4.5 months ago.  Interim history: No increased urination, blurry vision, nausea, chest pain.  Reviewed history: He does not have insurance so he cannot afford long-acting insulin analogs, a CGM, or an insulin pump.  He may start working full-time in the near future and will get insurance most likely next year, when the new office is ready.  Reviewed HbA1c levels: Lab Results  Component Value Date   HGBA1C 8.5 (A) 04/28/2020   HGBA1C 8.5 (A) 12/17/2019   HGBA1C 8.4 (A) 08/13/2019   Pt was on - ReliOn insulins: -NPH 15 units twice a day (tried to move second NPH dose at BT >> sugars higher in am) -Regular insulin/Humalog 12-15 units twice a day with B and D as he drops sugars if takes this with L.  Now on: - NPH 12 units in am and 20(-22) units at night - Humalog 12-15 units before B and D; no insulin before lunch  Meter: AccuCheck  Is checking sugars 0-2x a day: - am: 180-185 >> 160-170s >> 160-170 >> 45, 50s, 60s (before decreasing Lantus dose), 80-120 - 2h after b'fast: n/c >> 327 - before lunch: n/c - 2h after lunch: n/c - before dinner: 80-120 >> 100 >> 120-150s >> 100-120 >> 100-180 - 2h after dinner: n/c >> n/c - bedtime: n/c >> 150-160 >> n/c - nighttime:  n/c He does have lows.  Lowest sugar was 17 (!) >> seizure 2018; 37 >> ... 55 >> 46 (during the day) >> 45; he has hypoglycemia awareness in the 60s.   No previous hypoglycemia admission  !He cannot afford a glucagon kit! Highest sugar was 670 long time ago; more recently: 297 (traveling) >> 327 (missed Humalog).  No previous DKA admissions.    Pt's meals are: - Breakfast: 2/3 bowl oatmeal, butter, salt (12-15 units)  - Lunch: PBJ sandwich or a bowl of noodles (0 units) - Dinner: meat + veggies+ starch (45-60 CHO) - Snacks: crackers She saw nutrition 2006-2008.  -No CKD, last BUN/creatinine:  Lab Results  Component Value Date   BUN 14 04/28/2020   BUN 15 12/11/2018   CREATININE 0.92 04/28/2020   CREATININE 1.09 12/11/2018  He had urinary tract infections after his diabetes diagnosis, but these resolved.  -No HL; last set of lipids: Lab Results  Component Value Date   CHOL 177 04/28/2020   HDL 63.90 04/28/2020   LDLCALC 98 04/28/2020   TRIG 74.0 04/28/2020   CHOLHDL 3 04/28/2020    - last eye exam was in 2022: + DR.  He gets intraocular injections.  Dr. Jalene Mullet (who also his boss).  He had surgery for removal of retinal scarring 2018.  -+ Numbness in feet  Latest TSH was reviewed: Lab Results  Component Value Date   TSH 1.38 04/28/2020   Pt has FH of  DM1 in Paternal Great uncle. MGF and MGM had DM2.  He would not want to have children to avoid the risk of getting type 1 diabetes.  ROS: Constitutional: no weight gain/no weight loss, no fatigue, no subjective hyperthermia, no subjective hypothermia Eyes: no blurry vision, no xerophthalmia ENT: no sore throat, no nodules palpated in neck, no dysphagia, no odynophagia, no hoarseness Cardiovascular: no CP/no SOB/no palpitations/no leg swelling Respiratory: no cough/no SOB/no wheezing Gastrointestinal: no N/no V/no D/no C/no acid reflux Musculoskeletal: no muscle aches/no joint aches Skin: no rashes, no hair  loss Neurological: no tremors/+ numbness/no tingling/no dizziness  I reviewed pt's medications, allergies, PMH, social hx, family hx, and changes were documented in the history of present illness. Otherwise, unchanged from my initial visit note.  Past Medical History:  Diagnosis Date  . Chronic cough   . Diabetic retinopathy associated with type 1 diabetes mellitus ([facility]) 02/14/2017  . Seizure ([facility] [facility])   . Type 1 diabetes mellitus ([facility])    Past Surgical History:  Procedure Laterality Date  . EYE SURGERY     Social History   Socioeconomic History  . Marital status: Single    Spouse name: Not on file  . Number of children: 0   . Years of education: Not on file  . Highest education level: Not on file  Occupational History  .  IT/software development  Social Needs  . Financial resource strain: Not on file  . Food insecurity:    Worry: Not on file    Inability: Not on file  . Transportation needs:    Medical: Not on file    Non-medical: Not on file  Tobacco Use  . Smoking status: Never Smoker  . Smokeless tobacco: Never Used  Substance and Sexual Activity  . Alcohol use: No  . Drug use: Never  . Sexual activity: Not Currently  Lifestyle  . Physical activity:    Days per week: Not on file    Minutes per session: Not on file  . Stress: Not on file  Relationships  . Social connections:    Talks on phone: Not on file    Gets together: Not on file    Attends religious service: Not on file    Active member of club or organization: Not on file    Attends meetings of clubs or organizations: Not on file    Relationship status: Not on file  . Intimate partner violence:    Fear of current or ex partner: Not on file    Emotionally abused: Not on file    Physically abused: Not on file    Forced sexual activity: Not on file  Other Topics Concern  . Not on file  Social History Narrative  . Not on file   Current Outpatient Medications on File Prior to Visit  Medication Sig  Dispense Refill  . fluticasone (FLONASE) 50 MCG/ACT nasal spray Place 1 spray into both nostrils at bedtime. 16 g 1  . glucagon (GLUCAGON EMERGENCY) 1 MG injection Inject 1 mg into the muscle once as needed for up to 1 dose. 1 each 12  . glucose blood test strip 1 each by Other route as needed for other. Use as instructed    . insulin lispro (HUMALOG) 100 UNIT/ML injection INJECT 12 TO 15 UNITS UNDER THE SKIN TWICE DAILY BEFORE MEALS 40 mL 3  . insulin NPH Human (HUMULIN N,NOVOLIN N) 100 UNIT/ML injection Inject 15 Units into the skin 2 (two) times daily. Injecting 15 units in  the morning and 20 units at night     No current facility-administered medications on file prior to visit.   No Known Allergies No family history on file.  PE: BP 128/88 (BP Location: Right Arm, Patient Position: Sitting, Cuff Size: Normal)   Pulse 96   Ht 5' 9" (1.753 m)   Wt 176 lb (79.8 kg)   SpO2 96%   BMI 25.99 kg/m  Wt Readings from Last 3 Encounters:  09/01/20 176 lb (79.8 kg)  04/28/20 169 lb 12.8 oz (77 kg)  12/17/19 165 lb (74.8 kg)   Constitutional: normal weight, in NAD Eyes: PERRLA, EOMI, no exophthalmos ENT: moist mucous membranes, no thyromegaly, no cervical lymphadenopathy Cardiovascular: Tachycardia, RR, No MRG Respiratory: CTA B Gastrointestinal: abdomen soft, NT, ND, BS+ Musculoskeletal: no deformities, strength intact in all 4 Skin: moist, warm, no rashes Neurological: no tremor with outstretched hands, DTR normal in all 4  ASSESSMENT: 1. DM1, uncontrolled, with complications - PN - Diabetic foot ulcer - DR - History of seizures  PLAN:  1. Patient with longstanding, uncontrolled, type 1 diabetes, on basal-bolus insulin regimen.  Unfortunately, he does not have insurance and cannot afford a complex medication regimen.  We have him on Humalog and also NPH insulin vials, which he obtains without prescription.  None of the long-acting analog insulin alternatives are affordable for  him. -At last visit, sugars remain high in the morning so I advised him to move the NPH around midnight and to increase the dose slightly.  However, during the day, he was dropping his sugars after activity so I advised him to decrease the NPH in the morning.  Sugars at bedtime were at goal but he was not checking sugars consistently during the day so for now we continued the same dose of Humalog while advising him to check more frequently.  We discussed that the soonest he got insurance, we needed to start him on a CGM and switch NPH to an analog insulin.  However, this may be at least 6 months away.  I again advised him to establish care with a PCP but he wanted to wait until he got insurance before doing so. -At today's visit, sugars have improved in the morning, but he also developed some lows, mostly in the 50s and 60s, but even 1 at 45.  After these, he reduced the dose of NPH at night from 22 to 20.  Since then, he did not have any more significant lows except for an occasional blood sugar in the 60s.  The majority of the blood sugars, however, are at goal. -Before dinner, sugars fluctuate in the normal range but occasionally above and I believe that this is due to him not taking insulin before lunch.  He mentions that he does not take it as he is busy at work, but he also forgets it when he is not working.  It would be very important for him to take Humalog before this meal, also, and I strongly advised him to stop taking his sugars around lunchtime first to see approximately how much insulin he would need.  This will be easier to do when he gets insurance and will be able to get a CGM. -For now, we will continue the current regimen. -I suggested to: Patient Instructions  Please continue: - NPH 12 units in am and 20 units at night - Humalog 12-15 units before meals  Please come back for a follow-up appointment in 4 months.  - we checked his  HbA1c: 7.3% (better) - advised to check sugars at  different times of the day - 3x a day, rotating check times - advised for yearly eye exams >> he is UTD - return to clinic in 4 months   Total time spent for the visit: 25 min, in reviewing his blood sugars, discussing his hypo- and hyper-glycemic episodes, reviewing previous labs and insulin doses and developing a plan to avoid hypo- and hyper-glycemia.     Philemon Kingdom, MD PhD [facility]

## 2021-01-05 ENCOUNTER — Encounter: Payer: Self-pay | Admitting: Internal Medicine

## 2021-01-05 ENCOUNTER — Ambulatory Visit (INDEPENDENT_AMBULATORY_CARE_PROVIDER_SITE_OTHER): Payer: Self-pay | Admitting: Internal Medicine

## 2021-01-05 ENCOUNTER — Other Ambulatory Visit: Payer: Self-pay

## 2021-01-05 VITALS — BP 104/78 | HR 105 | Ht 69.0 in | Wt 168.6 lb

## 2021-01-05 DIAGNOSIS — E1042 Type 1 diabetes mellitus with diabetic polyneuropathy: Secondary | ICD-10-CM

## 2021-01-05 LAB — POCT GLYCOSYLATED HEMOGLOBIN (HGB A1C): Hemoglobin A1C: 8.2 % — AB (ref 4.0–5.6)

## 2021-01-05 MED ORDER — BAQSIMI ONE PACK 3 MG/DOSE NA POWD: NASAL | 11 refills | Status: DC

## 2021-01-05 MED ORDER — INSULIN LISPRO 100 UNIT/ML ~~LOC~~ SOLN: 9.0000 [IU] | Freq: Three times a day (TID) | SUBCUTANEOUS | 3 refills | Status: DC

## 2021-01-05 MED ORDER — INSULIN LISPRO 100 UNIT/ML ~~LOC~~ SOLN
9.0000 [IU] | Freq: Three times a day (TID) | SUBCUTANEOUS | 3 refills | Status: DC
Start: 2021-01-05 — End: 2021-01-05

## 2021-01-05 NOTE — Patient Instructions (Addendum)
Please continue: - NPH 11 units in am and 18-20 units at night - Humalog 9 units before B and D, and add: 4-5 units before L  Please come back for a follow-up appointment in 4 months.

## 2021-01-05 NOTE — Progress Notes (Signed)
Patient ID: Jonathan Armstrong, male   DOB: [birth year], 35 y.o.   MRN: [id number]   This visit occurred during the SARS-CoV-2 public health emergency.  Safety protocols were in place, including screening questions prior to the visit, additional usage of staff PPE, and extensive cleaning of exam room while observing appropriate contact time as indicated for disinfecting solutions.   HPI: Jonathan Armstrong is a 35 y.o.-year-old male, initially referred by his PCP, Patient, [name] (Inactive) , returning for f/u for DM1, dx 61 (35 y/o), uncontrolled, with complications (peripheral neuropathy, diabetic foot ulcer, diabetic retinopathy, history of seizure x1 2/2 hypoglycemia). He moved to [place] in 2017 from [place]. Last OV 4 months ago.  Interim history: No increased urination, blurry vision, nausea, chest pain. 2 days ago, he took double the amount of insulin >> tripped and sprained his R ankle. He had Covid this summer >> he still did not get his appetite back.  Reviewed history: He does not have insurance so he cannot afford long-acting insulin analogs, a CGM, or an insulin pump.   He may start working full-time in the near future and will get insurance most likely in 2023, when the new office is ready.  Reviewed HbA1c levels: Lab Results  Component Value Date   HGBA1C 7.3 (A) 09/01/2020   HGBA1C 8.5 (A) 04/28/2020   HGBA1C 8.5 (A) 12/17/2019   Pt was on - ReliOn insulins: -NPH 15 units twice a day (tried to move second NPH dose at BT >> sugars higher in am) -Regular insulin/Humalog 12-15 units twice a day with B and D as he drops sugars if takes this with L.  Now on: - NPH 12 units in am and 20(-22) >> 11 units in am and 18-20 units at night - Humalog 12-15 units before B and D; no insulin before lunch >> 9 units in am, no insulin for lunch, 9 units before dinner  Meter: AccuCheck  He is checking sugars 0-2x a day: - am:  160-170s >> 160-170 >> 45, 50s, 60s, 80-120 >> 100-120 - 2h  after b'fast: n/c >> 327 >> n/c - before lunch: n/c - 2h after lunch: n/c - before dinner: 120-150s >> 100-120 >> 100-180 >> 70-80s, mostly 150-170s - 2h after dinner: n/c >> n/c - bedtime: n/c >> 150-160 >> n/c >> 120s - nighttime: n/c He does have lows.  Lowest sugar was 17 (!) >> seizure 2018; 37 >> ... >> 45 >> 40; he has hypoglycemia awareness in the 60s.   No previous hypoglycemia admission  !He cannot afford a glucagon kit! Highest sugar was 670 long time ago; more recently: 327 (missed Humalog) >> 260.  No previous DKA admissions.    Pt's meals are: - Breakfast: 2/3 bowl oatmeal, butter, salt  - Lunch: PBJ sandwich or a bowl of noodles (0 units) - Dinner: meat + veggies+ starch (45-60 CHO) - Snacks: crackers She saw nutrition 2006-2008.  -No CKD, last BUN/creatinine:  Lab Results  Component Value Date   BUN 14 04/28/2020   BUN 15 12/11/2018   CREATININE 0.92 04/28/2020   CREATININE 1.09 12/11/2018  He had urinary tract infections after his diabetes diagnosis, but these resolved.  -No HL; last set of lipids: Lab Results  Component Value Date   CHOL 177 04/28/2020   HDL 63.90 04/28/2020   LDLCALC 98 04/28/2020   TRIG 74.0 04/28/2020   CHOLHDL 3 04/28/2020    - last eye exam was in 2022: + DR.  He gets  intraocular injections.  Dr. Jalene Mullet (who also his boss).  He had surgery for removal of retinal scarring 2018.  -+ Numbness in feet  Latest TSH was reviewed: Lab Results  Component Value Date   TSH 1.38 04/28/2020   Pt has FH of DM1 in Paternal Great uncle. MGF and MGM had DM2.  He would not want to have children to avoid the risk of getting type 1 diabetes.  ROS: + See HPI Neurological: no tremors/+ numbness/no tingling/no dizziness + Right ankle pain  I reviewed pt's medications, allergies, PMH, social hx, family hx, and changes were documented in the history of present illness. Otherwise, unchanged from my initial visit note.  Past Medical History:   Diagnosis Date   Chronic cough    Diabetic retinopathy associated with type 1 diabetes mellitus ([facility]) 02/14/2017   Seizure ([place])    Type 1 diabetes mellitus ([facility])    Past Surgical History:  Procedure Laterality Date   EYE SURGERY     Social History   Socioeconomic History   Marital status: Single    Spouse name: Not on file   Number of children: 0    Years of education: Not on file   Highest education level: Not on file  Occupational History    IT/software development  Social Needs   Financial resource strain: Not on file   Food insecurity:    Worry: Not on file    Inability: Not on file   Transportation needs:    Medical: Not on file    Non-medical: Not on file  Tobacco Use   Smoking status: Never Smoker   Smokeless tobacco: Never Used  Substance and Sexual Activity   Alcohol use: No   Drug use: Never   Sexual activity: Not Currently  Lifestyle   Physical activity:    Days per week: Not on file    Minutes per session: Not on file   Stress: Not on file  Relationships   Social connections:    Talks on phone: Not on file    Gets together: Not on file    Attends religious service: Not on file    Active member of club or organization: Not on file    Attends meetings of clubs or organizations: Not on file    Relationship status: Not on file   Intimate partner violence:    Fear of current or ex partner: Not on file    Emotionally abused: Not on file    Physically abused: Not on file    Forced sexual activity: Not on file  Other Topics Concern   Not on file  Social History Narrative   Not on file   Current Outpatient Medications on File Prior to Visit  Medication Sig Dispense Refill   fluticasone (FLONASE) 50 MCG/ACT nasal spray Place 1 spray into both nostrils at bedtime. 16 g 1   glucagon (GLUCAGON EMERGENCY) 1 MG injection Inject 1 mg into the muscle once as needed for up to 1 dose. 1 each 12   glucose blood test strip 1 each by Other route as needed for  other. Use as instructed     insulin lispro (HUMALOG) 100 UNIT/ML injection INJECT 12 TO 15 UNITS UNDER THE SKIN TWICE DAILY BEFORE MEALS 40 mL 3   insulin NPH Human (HUMULIN N,NOVOLIN N) 100 UNIT/ML injection Inject 15 Units into the skin 2 (two) times daily. Injecting 15 units in the morning and 20 units at night     No current  facility-administered medications on file prior to visit.   Not on File No family history on file.  PE: BP 104/78 (BP Location: Right Arm, Patient Position: Sitting, Cuff Size: Normal)   Pulse (!) 105   Ht 5' 9"  (1.753 m)   Wt 168 lb 9.6 oz (76.5 kg)   SpO2 96%   BMI 24.90 kg/m  Wt Readings from Last 3 Encounters:  01/05/21 168 lb 9.6 oz (76.5 kg)  09/01/20 176 lb (79.8 kg)  04/28/20 169 lb 12.8 oz (77 kg)   Constitutional: normal weight, in NAD Eyes: PERRLA, EOMI, no exophthalmos ENT: moist mucous membranes, no thyromegaly, no cervical lymphadenopathy Cardiovascular: Tachycardia, RR, No MRG Respiratory: CTA B Gastrointestinal: abdomen soft, NT, ND, BS+ Musculoskeletal: no deformities, strength intact in all 4, + R ankle wrapped, and in boot Skin: moist, warm, no rashes Neurological: no tremor with outstretched hands, DTR normal in all 4  ASSESSMENT: 1. DM1, uncontrolled, with complications - PN - Diabetic foot ulcer - DR - History of seizures  PLAN:  1. Patient with longstanding, uncontrolled, type 1 diabetes, on basal-bolus insulin regimen.  Unfortunately, he does not have insurance and cannot afford a complex medication regimen.  We have him on Humalog and intermediate acting insulin (NPH).  He uses vials, which he obtains without prescription.  None of the long-acting analog insulin alternatives were affordable for him in the past. -At last visit, sugars were better in the morning but he also had some lows, mostly in the 50s and 60s and even 1 at 45.  After these, he did reduce the dose of NPH and afterwards he did not experience any more  significant lows in the blood sugars were mostly at goal.  Before dinner, sugars were fluctuating in the normal range but occasionally above this and we discussed that this may be due to missing some of the insulin doses with lunch.  He was not taking it when busy at work and also forgetting it when he was not working.  I strongly advised him to try to take Humalog with this meal.  We again discussed about the importance of getting a CGM, which she plans to do immediately after he gets insurance.  We did not change the regimen at that time.  HbA1c was slightly better, at 7.3%. -At today's visit, he returns after he recovered from [place].  His appetite decreased at that time and did not recover yet.  He cut down the doses of insulin due to the fact that he is not eating as much.  He did have a low blood sugar episode, in the 40s, after he believes that he took his mealtime insulin twice.  Otherwise, reviewing his blood sugars at home, they are mostly at goal in the morning and after dinner, but they are above target before dinner.  I again advised him to try to take insulin at lunchtime, since as of now, especially as his NPH is peaking around that time, he is not taking any.  For now, I did not suggest another change in regimen but as his appetite picks up, he may need to return to the previous insulin doses. -He is hoping to get new insurance after he moves into the new office, may be sometimes next year.  At that time, we may be able to use a CGM.  As of now, he would like to pay out-of-pocket for a glucagon kit from.  I sent a prescription for Baqsimi to his pharmacy and gave him  a coupon card. -I suggested to: Patient Instructions  Please continue: - NPH 11 units in am and 18-20 units at night - Humalog 9 units before B and D, and add: 4-5 units before L  Please come back for a follow-up appointment in 4 months.  - we checked his HbA1c: 8.2% (higher) - advised to check sugars at different times of the  day - 3x a day, rotating check times - advised for yearly eye exams >> he is UTD - return to clinic in 4 months  Total time spent for the visit: 25 min, in reviewing his chart, his blood sugars, discussing his hypo- and hyper-glycemic episodes, reviewing previous labs and insulin doses and developing a plan to avoid hypo- and hyper-glycemia.   Philemon Kingdom, MD PhD [facility]

## 2021-05-18 ENCOUNTER — Ambulatory Visit (INDEPENDENT_AMBULATORY_CARE_PROVIDER_SITE_OTHER): Payer: Self-pay | Admitting: Internal Medicine

## 2021-05-18 ENCOUNTER — Encounter: Payer: Self-pay | Admitting: Internal Medicine

## 2021-05-18 ENCOUNTER — Other Ambulatory Visit: Payer: Self-pay

## 2021-05-18 VITALS — BP 128/80 | HR 96 | Ht 69.0 in | Wt 173.2 lb

## 2021-05-18 DIAGNOSIS — E1042 Type 1 diabetes mellitus with diabetic polyneuropathy: Secondary | ICD-10-CM

## 2021-05-18 LAB — POCT GLYCOSYLATED HEMOGLOBIN (HGB A1C): Hemoglobin A1C: 8.1 % — AB (ref 4.0–5.6)

## 2021-05-18 NOTE — Progress Notes (Signed)
Patient ID: Jonathan Armstrong, male   DOB: [birth year], 36 y.o.   MRN: [id number]   This visit occurred during the SARS-CoV-2 public health emergency.  Safety protocols were in place, including screening questions prior to the visit, additional usage of staff PPE, and extensive cleaning of exam room while observing appropriate contact time as indicated for disinfecting solutions.   HPI: Jonathan Armstrong is a 36 y.o.-year-old male, initially referred by his PCP, Patient, [name] (Inactive) , returning for f/u for DM1, dx 40 (36 y/o), uncontrolled, with complications (peripheral neuropathy, diabetic foot ulcer, diabetic retinopathy, history of seizure x1 2/2 hypoglycemia). He moved to [place] in 2017 from [place]. Last OV 4 months ago.  Interim history: No increased urination, blurry vision, nausea, chest pain. He would like to try a freestyle libre CGM and pay out-of-pocket for it.  Reviewed history: He does not have insurance so he cannot afford long-acting insulin analogs, a CGM, or an insulin pump.   He may start working full-time in the near future and will get insurance most likely in 2023, when the new office is ready.  Reviewed HbA1c levels: Lab Results  Component Value Date   HGBA1C 8.2 (A) 01/05/2021   HGBA1C 7.3 (A) 09/01/2020   HGBA1C 8.5 (A) 04/28/2020   Pt was on - ReliOn insulins: -NPH 15 units twice a day (tried to move second NPH dose at BT >> sugars higher in am) -Regular insulin/Humalog 12-15 units twice a day with B and D as he drops sugars if takes this with L.  Now on: - NPH 12 units in am and 20(-22) >> 10-11 units in am and 18-20 units at night - Humalog 12-15 units before B and D; no insulin before lunch >> 9 units in am, no insulin for lunch, 9 units before dinner  Meter: AccuCheck  He is checking sugars inconsistently, and mentions that he mostly checks when he feels that his sugars are low: - am:  160-170s >> 160-170 >> 45, 50s, 60s, 80-120 >> 100-120 >> ? -  2h after b'fast: n/c >> 327 >> n/c - before lunch: n/c >> may decrease to 32-50s - 2h after lunch: n/c - before dinner: 100-120 >> 100-180 >> 70-80s, mostly 150-170s >> ? - 2h after dinner: n/c >> n/c - bedtime: n/c >> 150-160 >> n/c >> 120s >> ? - nighttime: n/c He does have lows.  Lowest sugar was 17 (!) >> seizure 2018; 37 >> ... >> 45 >> 40 >> 32 x1; he has hypoglycemia awareness in the 60s.   No previous hypoglycemia admission.  At last visit, he wanted to pay out-of-pocket for Baqsimi.  I sent the prescription to his pharmacy.  I also gave him a coupon card. Highest sugar was 670 long time ago; more recently: 327 (missed Humalog) >> 260.  No previous DKA admissions.    Pt's meals are: - Breakfast: 2/3 bowl oatmeal, butter, salt  - Lunch: PBJ sandwich or a bowl of noodles (0 units) - Dinner: meat + veggies+ starch (45-60 CHO) - Snacks: crackers She saw nutrition 2006-2008.  -No CKD, last BUN/creatinine:  Lab Results  Component Value Date   BUN 14 04/28/2020   BUN 15 12/11/2018   CREATININE 0.92 04/28/2020   CREATININE 1.09 12/11/2018  He had urinary tract infections after his diabetes diagnosis, but these resolved.  -No HL; last set of lipids: Lab Results  Component Value Date   CHOL 177 04/28/2020   HDL 63.90 04/28/2020   LDLCALC  98 04/28/2020   TRIG 74.0 04/28/2020   CHOLHDL 3 04/28/2020    - last eye exam was in 2022: + DR.  He gets intraocular injections.  Dr. Jalene Mullet (who also his boss).  He had surgery for removal of retinal scarring 2018.  -+ Numbness in feet  Latest TSH was reviewed: Lab Results  Component Value Date   TSH 1.38 04/28/2020   Pt has FH of DM1 in Paternal Great uncle. MGF and MGM had DM2.  He would not want to have children to avoid the risk of getting type 1 diabetes.  ROS: + See HPI Neurological: no tremors/+ numbness/no tingling/no dizziness  I reviewed pt's medications, allergies, PMH, social hx, family hx, and changes were  documented in the history of present illness. Otherwise, unchanged from my initial visit note.  Past Medical History:  Diagnosis Date   Chronic cough    Diabetic retinopathy associated with type 1 diabetes mellitus (HCC) 02/14/2017   Seizure ([facility])    Type 1 diabetes mellitus (HCC)    Past Surgical History:  Procedure Laterality Date   EYE SURGERY     Social History   Socioeconomic History   Marital status: Single    Spouse name: Not on file   Number of children: 0    Years of education: Not on file   Highest education level: Not on file  Occupational History    IT/software development  Social Needs   Financial resource strain: Not on file   Food insecurity:    Worry: Not on file    Inability: Not on file   Transportation needs:    Medical: Not on file    Non-medical: Not on file  Tobacco Use   Smoking status: Never Smoker   Smokeless tobacco: Never Used  Substance and Sexual Activity   Alcohol use: No   Drug use: Never   Sexual activity: Not Currently  Lifestyle   Physical activity:    Days per week: Not on file    Minutes per session: Not on file   Stress: Not on file  Relationships   Social connections:    Talks on phone: Not on file    Gets together: Not on file    Attends religious service: Not on file    Active member of club or organization: Not on file    Attends meetings of clubs or organizations: Not on file    Relationship status: Not on file   Intimate partner violence:    Fear of current or ex partner: Not on file    Emotionally abused: Not on file    Physically abused: Not on file    Forced sexual activity: Not on file  Other Topics Concern   Not on file  Social History Narrative   Not on file   Current Outpatient Medications on File Prior to Visit  Medication Sig Dispense Refill   fluticasone (FLONASE) 50 MCG/ACT nasal spray Place 1 spray into both nostrils at bedtime. 16 g 1   Glucagon (BAQSIMI ONE PACK) 3 MG/DOSE POWD Use 1x as needed  for hypoglycemia 1 each 11   glucose blood test strip 1 each by Other route as needed for other. Use as instructed     insulin lispro (HUMALOG) 100 UNIT/ML injection Inject 0.09-0.15 mLs (9-15 Units total) into the skin 3 (three) times daily before meals. 40 mL 3   insulin NPH Human (HUMULIN N,NOVOLIN N) 100 UNIT/ML injection Inject 11 Units into the skin 2 (two)  times daily. Injecting 11-12 units in the morning and 18-22 units at night.      No current facility-administered medications on file prior to visit.   Not on File No family history on file.  PE: BP 128/80 (BP Location: Right Arm, Patient Position: Sitting, Cuff Size: Normal)    Pulse 96    Ht 5\' 9"  (1.753 m)    Wt 173 lb 3.2 oz (78.6 kg)    SpO2 97%    BMI 25.58 kg/m  Wt Readings from Last 3 Encounters:  05/18/21 173 lb 3.2 oz (78.6 kg)  01/05/21 168 lb 9.6 oz (76.5 kg)  09/01/20 176 lb (79.8 kg)   Constitutional: normal weight, in NAD Eyes: PERRLA, EOMI, no exophthalmos ENT: moist mucous membranes, no thyromegaly, no cervical lymphadenopathy Cardiovascular: Tachycardia, RR, No MRG Respiratory: CTA B Musculoskeletal: no deformities, strength intact in all 4 Skin: moist, warm, no rashes Neurological: no tremor with outstretched hands, DTR normal in all 4  ASSESSMENT: 1. DM1, uncontrolled, with complications - PN - Diabetic foot ulcer - DR - History of seizures  PLAN:  1. Patient with longstanding, uncontrolled, type 1 diabetes, on basal/bolus insulin regimen.  Unfortunately, he does not have insurance and cannot afford a complex medication regimen.  We have him on Humalog and intermediate acting insulin (NPH).  He uses vials, which he obtains without prescription.  None of the long-acting analog insulin alternatives are affordable for him in the past.  He is planning to change insurance, but he cannot do so yet. -At last visit, he returned after he recovered from [place].  His appetite was lower.  He cut down on the doses  of insulin due to the fact that he was not eating as much.  He did have a low blood sugar episode, in the 40s, after he believed that he took his mealtime insulin twice.  Otherwise, sugars were mostly at goal in the morning and after dinner but they were above target before dinner.  He was not taking insulin at lunchtime due to being busy at work.  We discussed that he will most likely need to start taking 50% of his regular insulin doses before lunch, if his appetite picked up. -At today's visit, unfortunately, we do not have enough blood sugar data to change his regimen.  He tells me that he is not taking the lunchtime Humalog usually.  He also mentions that his sugars are usually dropping before lunch despite the fact that he is eating breakfast and even has a snack in the middle of the morning.  His lowest blood sugar was 32, which he checked after exerting himself in the morning.  Usually, sugars are only dropping to the 50s and 60s.  At this visit, we discussed about possibly decreasing the NPH dose in the morning but he would want to wait until he gets a CGM to get a better idea about blood sugar trends. -He is interested to get a freestyle libre 2 CGM and is [place].  Since he cannot afford to pay $100 for a month, he is planning to only use it for 2 weeks of the month.  I encouraged him to continue to try to get it but I also gave him a sample of the freestyle libre 3 CGM to see how he likes it.  We discussed about the difference of checking his blood sugars with a CGM versus fingerstick.  We also  discussed about checking with fingerstick if he is correcting a low and I advised him about the lag time between capillary blood glucose and interstitial fluid glucose changes. -I also advised him to try to add Humalog before lunch, also. -I suggested to: Patient Instructions  Please continue: - NPH 10-11 (may need to decrease to 9) units in am  and 18-20 units at night - Humalog 9 units before B and D, and add  4-5 units before L  Get the book: "Think like a pancreas".  Please come back for a follow-up appointment in 4 months.  - we checked his HbA1c: 8.1%  (slightly lower) - advised to check sugars at different times of the day - 4x a day, rotating check times - advised for yearly eye exams >> he is UTD - he is due for labs but he is in a hurry today and would want to have this checked at next visit - return to clinic in 4 months  - Total time spent for the visit: 25 min, in reviewing his chart, his blood sugars, discussing his hypo and hyperglycemic episodes, reviewing previous labs and insulin doses and developing a plan to avoid hypo or and hyperglycemia   Philemon Kingdom, MD PhD [facility] Endocrinology

## 2021-05-18 NOTE — Patient Instructions (Addendum)
Please continue: - NPH 10-11 (may need to decrease to 9) units in am and 18-20 units at night - Humalog 9 units before B and D, and add  4-5 units before L  Get the book: "Think like a pancreas".  Please come back for a follow-up appointment in 4 months.

## 2021-05-21 ENCOUNTER — Other Ambulatory Visit: Payer: Self-pay | Admitting: Internal Medicine

## 2021-08-31 ENCOUNTER — Encounter: Payer: Self-pay | Admitting: Internal Medicine

## 2021-08-31 ENCOUNTER — Ambulatory Visit (INDEPENDENT_AMBULATORY_CARE_PROVIDER_SITE_OTHER): Payer: Self-pay | Admitting: Internal Medicine

## 2021-08-31 VITALS — BP 130/86 | HR 88 | Ht 69.0 in | Wt 171.6 lb

## 2021-08-31 DIAGNOSIS — E1042 Type 1 diabetes mellitus with diabetic polyneuropathy: Secondary | ICD-10-CM

## 2021-08-31 LAB — POCT GLYCOSYLATED HEMOGLOBIN (HGB A1C): Hemoglobin A1C: 8.4 % — AB (ref 4.0–5.6)

## 2021-08-31 NOTE — Addendum Note (Signed)
Addended by: Kenyon Ana on: 08/31/2021 02:37 PM   Modules accepted: Orders

## 2021-08-31 NOTE — Patient Instructions (Addendum)
For now, change:  - NPH 11 units in am and  22 units at night - Humalog: Before B: 10 >> 8-9 units Before L: 0 Before D: 12 units   Start the [place] 3 CGM.  Please stop at the lab.  Please return in 4 months.

## 2021-08-31 NOTE — Progress Notes (Signed)
Patient ID: Jonathan Armstrong, male   DOB: 05/23/1985, 36 y.o.   MRN: [id number]   HPI: Jonathan Armstrong is a 36 y.o.-year-old male, initially referred by his PCP, Patient, [name] (Inactive) , returning for f/u for DM1, dx 12 (36 y/o), uncontrolled, with complications (peripheral neuropathy, diabetic foot ulcer, diabetic retinopathy, history of seizure x1 2/2 hypoglycemia). He moved to [place] in 2017 from [place], [place]. Last OV 4 months ago.  Interim history: No increased urination, blurry vision, nausea, chest pain.  Reviewed history: He does not have insurance so he cannot afford long-acting insulin analogs, a CGM, or an insulin pump.   He may start working full-time in the near future and will get insurance most likely in 2023, when the new office is ready.  Reviewed HbA1c levels: Lab Results  Component Value Date   HGBA1C 8.1 (A) 05/18/2021   HGBA1C 8.2 (A) 01/05/2021   HGBA1C 7.3 (A) 09/01/2020   Pt was on - ReliOn insulins: -NPH 15 units twice a day (tried to move second NPH dose at BT >> sugars higher in am) -Regular insulin/Humalog 12-15 units twice a day with B and D as he drops sugars if takes this with L.  Now on: - NPH 11 (did not decrease to 9) units in am and 18-20 >> 22 units at night - Humalog 9 units before B and D, and add 4-5 units before L >> now:  Before B: 10 units Before L: 0 Before D: 12 units   Meter: AccuCheck  He is checking sugars inconsistently, and mentions that he mostly checks when he feels that his sugars are low: - am:   160-170 >> 45, 50s, 60s, 80-120 >> 100-120 >> ? - 2h after b'fast: n/c >> 327 >> n/c - before lunch: n/c >> may decrease to 32-50s - 2h after lunch: n/c - before dinner: 100-180 >> 70-80s, mostly 150-170s >> ? - 2h after dinner: n/c >> n/c - bedtime: n/c >> 150-160 >> n/c >> 120s >> ? - nighttime: n/c He does have lows.  Lowest sugar was 17 (!) >> seizure 2018; 37 >> ... >>  32 x1 >> 44; he has hypoglycemia awareness in the 60s.    No previous hypoglycemia admission.  At last visit, he wanted to pay out-of-pocket for Baqsimi.  I sent the prescription to his pharmacy.  I also gave him a coupon card. Highest sugar was 670 long time ago; more recently: 327 (missed Humalog) >> 260 >> /.  No previous DKA admissions.    Pt's meals are: - Breakfast: 2/3 bowl oatmeal, butter, salt  - Lunch: PBJ sandwich or a bowl of noodles (0 units) - Dinner: meat + veggies+ starch (45-60 CHO) - Snacks: crackers She saw nutrition 2006-2008.  -No CKD, last BUN/creatinine:  Lab Results  Component Value Date   BUN 14 04/28/2020   BUN 15 12/11/2018   CREATININE 0.92 04/28/2020   CREATININE 1.09 12/11/2018  He had urinary tract infections after his diabetes diagnosis, but these resolved.  -No HL; last set of lipids: Lab Results  Component Value Date   CHOL 177 04/28/2020   HDL 63.90 04/28/2020   LDLCALC 98 04/28/2020   TRIG 74.0 04/28/2020   CHOLHDL 3 04/28/2020    - last eye exam was in 2023: + DR.  He gets intraocular injections.  Dr. Carmela Rima (who also his boss).  He had surgery for removal of retinal scarring 2018.  -+ Numbness in feet.  Latest TSH was reviewed:  Lab Results  Component Value Date   TSH 1.38 04/28/2020   Pt has FH of DM1 in Paternal Great uncle. MGF and MGM had DM2.  He would not want to have children to avoid the risk of getting type 1 diabetes.  ROS: + See HPI Neurological: no tremors/+ numbness/no tingling/no dizziness  I reviewed pt's medications, allergies, PMH, social hx, family hx, and changes were documented in the history of present illness. Otherwise, unchanged from my initial visit note.  Past Medical History:  Diagnosis Date   Chronic cough    Diabetic retinopathy associated with type 1 diabetes mellitus (HCC) 02/14/2017   Seizure ([facility])    Type 1 diabetes mellitus (HCC)    Past Surgical History:  Procedure Laterality Date   EYE SURGERY     Social History   Socioeconomic  History   Marital status: Single    Spouse name: Not on file   Number of children: 0    Years of education: Not on file   Highest education level: Not on file  Occupational History    IT/software development  Social Needs   Financial resource strain: Not on file   Food insecurity:    Worry: Not on file    Inability: Not on file   Transportation needs:    Medical: Not on file    Non-medical: Not on file  Tobacco Use   Smoking status: Never Smoker   Smokeless tobacco: Never Used  Substance and Sexual Activity   Alcohol use: No   Drug use: Never   Sexual activity: Not Currently  Lifestyle   Physical activity:    Days per week: Not on file    Minutes per session: Not on file   Stress: Not on file  Relationships   Social connections:    Talks on phone: Not on file    Gets together: Not on file    Attends religious service: Not on file    Active member of club or organization: Not on file    Attends meetings of clubs or organizations: Not on file    Relationship status: Not on file   Intimate partner violence:    Fear of current or ex partner: Not on file    Emotionally abused: Not on file    Physically abused: Not on file    Forced sexual activity: Not on file  Other Topics Concern   Not on file  Social History Narrative   Not on file   Current Outpatient Medications on File Prior to Visit  Medication Sig Dispense Refill   fluticasone (FLONASE) 50 MCG/ACT nasal spray Place 1 spray into both nostrils at bedtime. 16 g 1   Glucagon (BAQSIMI ONE PACK) 3 MG/DOSE POWD Use 1x as needed for hypoglycemia 1 each 11   glucose blood test strip 1 each by Other route as needed for other. Use as instructed     HUMALOG 100 UNIT/ML injection INJECT 0.09 TO 0.15 ML(9 TO 15 UNITS TOTAL) UNDER THE SKIN THREE TIMES DAILY BEFORE MEALS 40 mL 2   insulin NPH Human (HUMULIN N,NOVOLIN N) 100 UNIT/ML injection Inject 11 Units into the skin 2 (two) times daily. Injecting 11-12 units in the morning  and 18-22 units at night.      No current facility-administered medications on file prior to visit.   No Known Allergies No family history on file.  PE: There were no vitals taken for this visit. Wt Readings from Last 3 Encounters:  05/18/21 173 lb  3.2 oz (78.6 kg)  01/05/21 168 lb 9.6 oz (76.5 kg)  09/01/20 176 lb (79.8 kg)   Constitutional: normal weight, in NAD Eyes: EOMI, no exophthalmos ENT: moist mucous membranes, no neck masses palpated, no cervical lymphadenopathy Cardiovascular: RRR, No MRG Respiratory: CTA B Musculoskeletal: no deformities Skin: moist, warm, no rashes Neurological: no tremor with outstretched hands Diabetic Foot Exam - Simple   Simple Foot Form Diabetic Foot exam was performed with the following findings: Yes 08/31/2021  3:00 PM  Visual Inspection No deformities, no ulcerations, no other skin breakdown bilaterally: Yes See comments: Yes Sensation Testing See comments: Yes Pulse Check Posterior Tibialis and Dorsalis pulse intact bilaterally: Yes Comments Calluses on medial aspect of bilateral halluces. Decreased sensation to monofilament on the dorsal and plantar aspect of bilateral toes.    ASSESSMENT: 1. DM1, uncontrolled, with complications - PN - Diabetic foot ulcer - DR - History of seizures  PLAN:  1. Patient with longstanding, uncontrolled, type 1 diabetes, on basal/bolus insulin regimen.  Unfortunately, he does not have insurance and cannot afford a complex medication regimen.  We have him on Humalog and intermediate acting insulin (NPH).  He uses vials, which he obtains without prescription.  Previously, none of the long-acting analog insulins were covered for him. -At last visit, we did not have enough blood sugar data to change his regimen.  He was not taking the lunchtime Humalog usually.  I strongly advised him to add this back.  At that time, he was eating breakfast and sugars were dropping before lunch despite the fact that he was  even having a snack midmorning.  Lowest sugar was 32, which happened after he exerted himself in the morning.  Usually, sugars were dropping to the 50s or 60s.  We discussed about possibly decreasing the NPH dose in the morning but I did advise him to get the CGM if possible to help with identifying trends.  I did give him a sample at last visit.  At that time, his HbA1c was 8.1%.  I also recommended at that the book "Think like a pancreas". -At today's visit, he does not have a CGM as the one that I gave him last time was defective, and he did not order a new one.  He is not checking blood sugars.  He only checks them when he feels low and has seen low blood sugars even to the 40s, and mentions that most commonly, they happen approximately 2 hours after breakfast.  Upon reviewing his insulin doses at home, he is actually using higher doses than recommended at last visit as he forgot the recommendations from them.  Also, he is still not bolusing Humalog before lunch as he is busy at work. -He is very disengaged from his diabetes care and until he decides to take more control over this, I am afraid I cannot help him.  For now, I did advise him to decrease his breakfast Humalog slightly but without blood sugars I cannot make any other changes.  I gave him new freestyle libre 3 CGM and I applied this on the back of his right arm.  I strongly encouraged him to continue using the CGM afterwards. -I suggested to: Patient Instructions  For now, change:  - NPH 11 units in am and  22 units at night - Humalog: Before B: 10 >> 8-9 units Before L: 0 Before D: 12 units   Start the [place] 3 CGM.  Please stop at the lab.  Please return  in 4 months.  - we checked his HbA1c: 8.4% (higher) - advised to check sugars at different times of the day - 4x a day, rotating check times - advised for yearly eye exams >> he is UTD, and also continues to get IO injections, last 3 weeks ago.  I advised him to have Dr. Allena KatzPatel  send me his eye reports. - We will check annual labs today - return to clinic in 3-4 months  - Total time spent for the visit: 25 min, in precharting, reviewing his chart, his blood sugars, discussing his hypo and hyperglycemic episodes, reviewing previous labs and insulin doses and developing a plan to avoid hypo or hyperglycemia.  Carlus Pavlovristina Ardra Kuznicki, MD PhD [facility]

## 2021-09-01 LAB — COMPREHENSIVE METABOLIC PANEL
ALT: 23 U/L (ref 0–53)
AST: 23 U/L (ref 0–37)
Albumin: 4.3 g/dL (ref 3.5–5.2)
Alkaline Phosphatase: 75 U/L (ref 39–117)
BUN: 12 mg/dL (ref 6–23)
CO2: 32 mEq/L (ref 19–32)
Calcium: 9.5 mg/dL (ref 8.4–10.5)
Chloride: 101 mEq/L (ref 96–112)
Creatinine, Ser: 0.9 mg/dL (ref 0.40–1.50)
GFR: 109.13 mL/min (ref >60.00)
Glucose, Bld: 65 mg/dL — ABNORMAL LOW (ref 70–99)
Potassium: 4.5 mEq/L (ref 3.5–5.1)
Sodium: 140 mEq/L (ref 135–145)
Total Bilirubin: 0.6 mg/dL (ref 0.2–1.2)
Total Protein: 7.8 g/dL (ref 6.0–8.3)

## 2021-09-01 LAB — LIPID PANEL
Cholesterol: 177 mg/dL (ref 0–200)
HDL: 63.8 mg/dL (ref >39.00)
LDL Cholesterol: 99 mg/dL (ref 0–99)
NonHDL: 113.52
Total CHOL/HDL Ratio: 3
Triglycerides: 72 mg/dL (ref 0.0–149.0)
VLDL: 14.4 mg/dL (ref 0.0–40.0)

## 2021-09-01 LAB — MICROALBUMIN / CREATININE URINE RATIO
Creatinine,U: 52 mg/dL
Microalb Creat Ratio: 1.6 mg/g (ref 0.0–30.0)
Microalb, Ur: 0.9 mg/dL (ref 0.0–1.9)

## 2021-09-01 LAB — TSH: TSH: 1.95 u[IU]/mL (ref 0.35–5.50)

## 2021-09-14 ENCOUNTER — Encounter: Payer: Self-pay | Admitting: Internal Medicine

## 2021-09-14 DIAGNOSIS — E1042 Type 1 diabetes mellitus with diabetic polyneuropathy: Secondary | ICD-10-CM

## 2021-09-15 MED ORDER — FREESTYLE LIBRE 3 SENSOR MISC: 1.0000 | 3 refills | Status: DC

## 2022-01-11 ENCOUNTER — Other Ambulatory Visit: Payer: Self-pay | Admitting: Internal Medicine

## 2022-01-11 ENCOUNTER — Ambulatory Visit (INDEPENDENT_AMBULATORY_CARE_PROVIDER_SITE_OTHER): Payer: Self-pay | Admitting: Internal Medicine

## 2022-01-11 ENCOUNTER — Encounter: Payer: Self-pay | Admitting: Internal Medicine

## 2022-01-11 VITALS — BP 120/76 | HR 96 | Ht 69.0 in | Wt 167.6 lb

## 2022-01-11 DIAGNOSIS — E1042 Type 1 diabetes mellitus with diabetic polyneuropathy: Secondary | ICD-10-CM

## 2022-01-11 DIAGNOSIS — Z5989 Other problems related to housing and economic circumstances: Secondary | ICD-10-CM | POA: Insufficient documentation

## 2022-01-11 LAB — POCT GLYCOSYLATED HEMOGLOBIN (HGB A1C): Hemoglobin A1C: 6.7 % — AB (ref 4.0–5.6)

## 2022-01-11 NOTE — Patient Instructions (Addendum)
Please use the following regimen:  - NPH 11 >> 9 units in am and 18 >> 15 units at night - Humalog: Before B:  8 units Before L: 0 (try to take 2-4 units) Before D: 8-10 units   Use the following SSI: 150-200: + 0.5 unit 201-250: + 1 units 251-300: + 2 units > 300: + 3 units  Please return in 4 months.

## 2022-01-11 NOTE — Progress Notes (Signed)
Patient ID: Jonathan Armstrong, male   DOB: 1985/08/10, 36 y.o.   MRN: [id number]   HPI: Jonathan Armstrong is a 36 y.o.-year-old male, initially referred by his PCP, Patient, [name] , returning for f/u for DM1, dx 38 (36 y/o), uncontrolled, with complications (peripheral neuropathy, diabetic foot ulcer, diabetic retinopathy, history of seizure x1 2/2 hypoglycemia). He moved to [place] in 2017 from [place]. Last OV 4 months ago. He does not have medical insurance.  Interim history: No increased urination, blurry vision, nausea, chest pain.  Reviewed history: He does not have insurance so he cannot afford long-acting insulin analogs, a CGM, or an insulin pump.   He may start working full-time in the near future and will get insurance when the new office is ready.  Reviewed HbA1c levels: Lab Results  Component Value Date   HGBA1C 8.4 (A) 08/31/2021   HGBA1C 8.1 (A) 05/18/2021   HGBA1C 8.2 (A) 01/05/2021   Pt was on - ReliOn insulins: -NPH 15 units twice a day (tried to move second NPH dose at BT >> sugars higher in am) -Regular insulin/Humalog 12-15 units twice a day with B and D as he drops sugars if takes this with L.  Now on: - NPH 11 (did not decrease to 9) units in am and 18-20 >> 22 >> 18 units at night - Humalog (vials) 9 units before B and D, and add 4-5 units before L >> now:  Before B: 10 >> 8-9 >> using 8 units Before L: 0 Before D: 12 >> using 8 units   Meter: AccuCheck  He was checking sugars inconsistently before >> now has a [employer] [place] CGM - checks >4x a day: -We could not download his CGM report today. Reviewed trends on his phone: Sugars dropping overnight and they increase after almost every meal to 400 Prev.: - am:   160-170 >> 45, 50s, 60s, 80-120 >> 100-120 >> ? - 2h after b'fast: n/c >> 327 >> n/c - before lunch: n/c >> may decrease to 32-50s - 2h after lunch: n/c - before dinner: 100-180 >> 70-80s, mostly 150-170s >> ? - 2h after dinner: n/c >> n/c -  bedtime: n/c >> 150-160 >> n/c >> 120s >> ? - nighttime: n/c He does have lows.  Lowest sugar was 17 (!) >> seizure 2018; 36 >> ... >>  32 x1 >> 44 >> 50s; he has hypoglycemia awareness in the 60s.   No previous hypoglycemia admission.  At last visit, he wanted to pay out-of-pocket for Baqsimi.  I sent the prescription to his pharmacy.  I also gave him a coupon card. Highest sugar was 670 long time ago; more recently: 327 (missed Humalog) >> 260 >> 400.  No previous DKA admissions.    Pt's meals are: - Breakfast: 2/3 bowl oatmeal, butter, salt  - Lunch: PBJ sandwich or a bowl of noodles (0 units) - Dinner: meat + veggies+ starch (45-60 CHO) - Snacks: crackers She saw nutrition 2006-2008.  -No CKD, last BUN/creatinine:  Lab Results  Component Value Date   BUN 12 08/31/2021   BUN 14 04/28/2020   CREATININE 0.90 08/31/2021   CREATININE 0.92 04/28/2020  He had urinary tract infections after his diabetes diagnosis, but these resolved.  -No HL; last set of lipids: Lab Results  Component Value Date   CHOL 177 08/31/2021   HDL 63.80 08/31/2021   LDLCALC 99 08/31/2021   TRIG 72.0 08/31/2021   CHOLHDL 3 08/31/2021    - last eye  exam was in 2023: + DR.  He gets intraocular injections.  Dr. Carmela Rima (who also his boss).  He had surgery for removal of retinal scarring 2018.  -+ Numbness in feet. Last foot exam: 08/31/2021  Latest TSH was reviewed: Lab Results  Component Value Date   TSH 1.95 08/31/2021   Pt has FH of DM1 in Paternal Great uncle. MGF and MGM had DM2.  He would not want to have children to avoid the risk of getting type 1 diabetes.  ROS: + See HPI Neurological: no tremors/+ numbness/no tingling/no dizziness  I reviewed pt's medications, allergies, PMH, social hx, family hx, and changes were documented in the history of present illness. Otherwise, unchanged from my initial visit note.  Past Medical History:  Diagnosis Date   Chronic cough    Diabetic  retinopathy associated with type 1 diabetes mellitus (HCC) 02/14/2017   Seizure (HCC)    Type 1 diabetes mellitus (HCC)    Past Surgical History:  Procedure Laterality Date   EYE SURGERY     Social History   Socioeconomic History   Marital status: Single    Spouse name: Not on file   Number of children: 0    Years of education: Not on file   Highest education level: Not on file  Occupational History    IT/software development  Social Needs   Financial resource strain: Not on file   Food insecurity:    Worry: Not on file    Inability: Not on file   Transportation needs:    Medical: Not on file    Non-medical: Not on file  Tobacco Use   Smoking status: Never Smoker   Smokeless tobacco: Never Used  Substance and Sexual Activity   Alcohol use: No   Drug use: Never   Sexual activity: Not Currently  Lifestyle   Physical activity:    Days per week: Not on file    Minutes per session: Not on file   Stress: Not on file  Relationships   Social connections:    Talks on phone: Not on file    Gets together: Not on file    Attends religious service: Not on file    Active member of club or organization: Not on file    Attends meetings of clubs or organizations: Not on file    Relationship status: Not on file   Intimate partner violence:    Fear of current or ex partner: Not on file    Emotionally abused: Not on file    Physically abused: Not on file    Forced sexual activity: Not on file  Other Topics Concern   Not on file  Social History Narrative   Not on file   Current Outpatient Medications on File Prior to Visit  Medication Sig Dispense Refill   Continuous Blood Gluc Sensor (FREESTYLE LIBRE 3 SENSOR) MISC 1 applicator by Does not apply route every 14 (fourteen) days. 6 each 3   fluticasone (FLONASE) 50 MCG/ACT nasal spray Place 1 spray into both nostrils at bedtime. 16 g 1   Glucagon (BAQSIMI ONE PACK) 3 MG/DOSE POWD Use 1x as needed for hypoglycemia 1 each 11    glucose blood test strip 1 each by Other route as needed for other. Use as instructed     HUMALOG 100 UNIT/ML injection INJECT 0.09 TO 0.15 ML(9 TO 15 UNITS TOTAL) UNDER THE SKIN THREE TIMES DAILY BEFORE MEALS 40 mL 2   insulin NPH Human (HUMULIN N,NOVOLIN N)  100 UNIT/ML injection Inject 11 Units into the skin 2 (two) times daily. Injecting 11-12 units in the morning and 18-22 units at night.      No current facility-administered medications on file prior to visit.   No Known Allergies No family history on file.  PE: BP 120/76 (BP Location: Right Arm, Patient Position: Sitting, Cuff Size: Normal)   Pulse 96   Ht 5\' 9"  (1.753 m)   Wt 167 lb 9.6 oz (76 kg)   SpO2 99%   BMI 24.75 kg/m  Wt Readings from Last 3 Encounters:  01/11/22 167 lb 9.6 oz (76 kg)  08/31/21 171 lb 9.6 oz (77.8 kg)  05/18/21 173 lb 3.2 oz (78.6 kg)   Constitutional: normal weight, in NAD Eyes: EOMI, no exophthalmos ENT: no neck masses palpated, no cervical lymphadenopathy Cardiovascular: Tachycardia, RR, No MRG Respiratory: CTA B Musculoskeletal: no deformities Skin: moist, warm, no rashes Neurological: no tremor with outstretched hands  ASSESSMENT: 1. DM1, uncontrolled, with complications - PN - Diabetic foot ulcer - DR - History of seizures  2. Lack of insurance  PLAN:  1. Patient with longstanding, uncontrolled, type 1 diabetes, on basal/bolus insulin regimen with NPH and Humalog.  Unfortunately, he does not have insurance and cannot afford the complex medication regimen.  He uses vials of NPH, which he can obtain without prescription.  Previously, none of the long-acting analog insulins are covered for him. -I highly recommended a CGM at last visit.  I gave him a CGM at the previous visit but this was defective and he did not order a new one.  At last visit he was not checking blood sugars, only taking that when he felt low and they were fluctuating between 40s-200s.  We discussed that it would be  possible to manage his diabetic regimen without checking blood sugars.  He was very disengaged from his diabetes care and I advised him that I could not help him unless he decided to take more control over this.  I did advise him to try to lower the Humalog insulin before breakfast to avoid lower blood sugars 2 hours after breakfast, since he was using higher doses of the rapid acting insulin the recommended.  I gave him a new freestyle libre 3 CGM sample and applied it on the back of his arm.  I strongly advised him to try to continue using these. At last visit, HbA1c was higher, at 8.4%. CGM interpretation: -I reviewed the reports on his phone for the last several days.  Sugars are dropping drastically overnight, and they increase after every meal.  Upon questioning, sugars go very high after dinner and then he takes correction Humalog - approximately 3 units, after which, sugars are dropping fairly abruptly with a nadir in the morning.  The downward trend overnight is obvious even without correcting blood sugars at night but is more pronounced whenever he corrects.  At this visit, I again advised him to reduce his NPH at night but also in the morning, since he mentions that he is not able to take enough Humalog due to fear of dropping his sugars too much during the day.  I also advised him that it appears that he absolutely needs insulin before lunch, also.  He probably needs a lower amount, though. -He feels, and I agree, that he needs a sliding scale.  I gave him a conservative sliding scale, mostly for correcting blood sugars after meals, but he can use this even before meals.  Since  he tells me that he can draw up half units as he uses vials, I will have him inject only half a unit if sugars are slightly higher than 150. -I suggested to: Patient Instructions  Please use the following regimen:  - NPH 11 >> 9 units in am and 18 >> 15 units at night - Humalog: Before B:  8 units Before L: 0 (try to  take 2-4 units) Before D: 8-10 units   Use the following SSI: 150-200: + 0.5 unit 201-250: + 1 units 251-300: + 2 units > 300: + 3 units  Please return in 4 months.  - we checked his HbA1c: 6.7% (best in a long time!) - advised to check sugars at different times of the day - 4x a day, rotating check times - advised for yearly eye exams >> he is UTD -continues intraocular injections with Dr. Allena Katz.  I did advise him to asked Dr. Allena Katz to send me his most recent dilated eye exam report. - return to clinic in 4 months  2. Lack of insurance -Patient is using NPH because he cannot afford analog insulins.  We did discuss that the price of Lantus, for example, is now, but he mentioned that he tried to obtain this but in his case this was not Due to the lack of insurance. -At next visit, we can try again in analog insulin.  I advised him to check again with his insurance whether Lantus or another long-acting insulin would be covered and let me know. -He is also interested in an insulin pump, which he is not able to afford right now.  Carlus Pavlov, MD PhD [facility] Endocrinology

## 2022-04-30 ENCOUNTER — Other Ambulatory Visit: Payer: Self-pay | Admitting: Internal Medicine

## 2022-04-30 DIAGNOSIS — E1042 Type 1 diabetes mellitus with diabetic polyneuropathy: Secondary | ICD-10-CM

## 2022-05-24 ENCOUNTER — Encounter: Payer: Self-pay | Admitting: Internal Medicine

## 2022-05-24 ENCOUNTER — Ambulatory Visit (INDEPENDENT_AMBULATORY_CARE_PROVIDER_SITE_OTHER): Payer: Self-pay | Admitting: Internal Medicine

## 2022-05-24 VITALS — BP 130/86 | HR 91 | Ht 69.0 in | Wt 172.6 lb

## 2022-05-24 DIAGNOSIS — E1042 Type 1 diabetes mellitus with diabetic polyneuropathy: Secondary | ICD-10-CM

## 2022-05-24 DIAGNOSIS — Z5989 Other problems related to housing and economic circumstances: Secondary | ICD-10-CM

## 2022-05-24 LAB — POCT GLYCOSYLATED HEMOGLOBIN (HGB A1C): Hemoglobin A1C: 6.8 % — AB (ref 4.0–5.6)

## 2022-05-24 MED ORDER — INSULIN LISPRO 100 UNIT/ML IJ SOLN: INTRAMUSCULAR | 3 refills | Status: DC

## 2022-05-24 MED ORDER — BAQSIMI ONE PACK 3 MG/DOSE NA POWD: NASAL | 11 refills | Status: AC

## 2022-05-24 NOTE — Progress Notes (Signed)
Jonathan Armstrong ID: Jonathan Armstrong, male   DOB: 26-Apr-1985, 37 y.o.   MRN: [id number]   HPI: Jonathan Armstrong is a 37 y.o.-year-old malear-old male, initially referred by his Jonathan Armstrong, Jonathan Armstrong, Jonathan Armstrong , returning for f/u for DM1, dx 3 (37 y/o), uncontrolled, with complications (peripheral neuropathy, diabetic foot ulcer, diabetic retinopathy, history of seizure x1 2/2 hypoglycemia). He moved to [place] in 2017 from [place]. Last OV 4 months ago. He does not have medical insurance.  Interim history: Jonathan increased urination, blurry vision, nausea, chest pain. He had increased fatigue as he is not sleeping well.  He tells me he cannot fall asleep.  This is a chronic problem. At today's visit, he has a CGM but he declines sharing the information with the clinic.  Reviewed history: He does not have insurance so he cannot afford long-acting insulin analogs, a CGM, or an insulin pump.   He may start working full-time in the near future and will get insurance when the new office is ready.  Reviewed HbA1c levels: Lab Results  Component Value Date   HGBA1C 6.7 (A) 01/11/2022   HGBA1C 8.4 (A) 08/31/2021   HGBA1C 8.1 (A) 05/18/2021   Pt was on - ReliOn insulins: -NPH 15 units twice a day (tried to move second NPH dose at BT >> sugars higher in am) -Regular insulin/Humalog 12-15 units twice a day with B and D as he drops sugars if takes this with L.  Now on:(changed by him since last OV) - NPH 11 >> 9 units in am and 18 >> 15 >> 22 units at night - Humalog: Before B:  8 units Before L: 0 (try to take 2-4 units) Before D: (8)-10 units  +SSI: 150-200: + 0.5 unit 201-250: + 1 units 251-300: + 2 units > 300: + 3 units Meter: AccuCheck  He was checking sugars inconsistently before >> now has a Jonathan Armstrong CGM - checks >4x a day: -We could not download his CGM report today. Reviewed trends on his phone:  - am:   160-170 >> 45, 50s, 60s, 80-120 >> 100-120 >> ? >> 90-120 - 2h after b'fast: n/c >> 327 >> n/c -  before lunch: n/c >> may decrease to 32-50s >> 100-120 (occas. after a snack) - 2h after lunch: n/c - before dinner: 100-180 >> 70-80s, mostly 150-170s >> ? >> 80-100 - 2h after dinner: n/c >> n/c - bedtime: n/c >> 150-160 >> n/c >> 120s >> ? >> 130-200 - nighttime: n/c He does have lows.  Lowest sugar was 17 (!) >> seizure 2018 ... >>  32 x1 >> 44 >> 50s >> 45; he has hypoglycemia awareness in the 60s.   Jonathan previous hypoglycemia admission.   He wanted to pay out-of-pocket for Baqsimi.  I sent the prescription to his pharmacy.  I also gave him a coupon card. Highest sugar was 670 long time ago; more recently: 400 >> 320.  Jonathan previous DKA admissions.    Pt's meals are: - Breakfast: 2/3 bowl oatmeal, butter, salt  - Lunch: PBJ sandwich or a bowl of noodles (0 units) - Dinner: meat + veggies+ starch (45-60 CHO) - Snacks: crackers She saw nutrition 2006-2008.  -Jonathan CKD, last BUN/creatinine:  Lab Results  Component Value Date   BUN 12 08/31/2021   BUN 14 04/28/2020   CREATININE 0.90 08/31/2021   CREATININE 0.92 04/28/2020  He had urinary tract infections after his diabetes diagnosis, but these resolved.  -Jonathan HL; last set of lipids: Lab Results  Component Value Date   CHOL 177 08/31/2021   HDL 63.80 08/31/2021   LDLCALC 99 08/31/2021   TRIG 72.0 08/31/2021   CHOLHDL 3 08/31/2021    - last eye exam was in 2023: + DR.  He gets intraocular injections.  Dr. Jalene Mullet (who also his boss).  He had surgery for removal of retinal scarring 2018.  -+ Numbness in feet. Last foot exam: 08/31/2021  Latest TSH was reviewed: Lab Results  Component Value Date   TSH 1.95 08/31/2021   Pt has FH of DM1 in Paternal Great uncle. MGF and MGM had DM2.  He would not want to have children to avoid the risk of getting type 1 diabetes.  ROS: + See HPI  I reviewed pt's medications, allergies, PMH, social hx, family hx, and changes were documented in the history of present illness. Otherwise,  unchanged from my initial visit note.  Past Medical History:  Diagnosis Date   Chronic cough    Diabetic retinopathy associated with type 1 diabetes mellitus (HCC) 02/14/2017   Seizure ([place])    Type 1 diabetes mellitus (HCC)    Past Surgical History:  Procedure Laterality Date   EYE SURGERY     Social History   Socioeconomic History   Marital status: Single    Spouse name: Not on file   Number of children: 0    Years of education: Not on file   Highest education level: Not on file  Occupational History    IT/software development  Social Needs   Financial resource strain: Not on file   Food insecurity:    Worry: Not on file    Inability: Not on file   Transportation needs:    Medical: Not on file    Non-medical: Not on file  Tobacco Use   Smoking status: Never Smoker   Smokeless tobacco: Never Used  Substance and Sexual Activity   Alcohol use: Jonathan   Drug use: Never   Sexual activity: Not Currently  Lifestyle   Physical activity:    Days Armstrong week: Not on file    Minutes Armstrong session: Not on file   Stress: Not on file  Relationships   Social connections:    Talks on phone: Not on file    Gets together: Not on file    Attends religious service: Not on file    Active member of club or organization: Not on file    Attends meetings of clubs or organizations: Not on file    Relationship status: Not on file   Intimate partner violence:    Fear of current or ex partner: Not on file    Emotionally abused: Not on file    Physically abused: Not on file    Forced sexual activity: Not on file  Other Topics Concern   Not on file  Social History Narrative   Not on file   Current Outpatient Medications on File Prior to Visit  Medication Sig Dispense Refill   Continuous Blood Gluc Sensor (FREESTYLE LIBRE 3 SENSOR) MISC Use 1 sensor every 14 days. 6 each 2   fluticasone (FLONASE) 50 MCG/ACT nasal spray Place 1 spray into both nostrils at bedtime. 16 g 1   Glucagon (BAQSIMI  ONE PACK) 3 MG/DOSE POWD Use 1x as needed for hypoglycemia 1 each 11   glucose blood test strip 1 each by Other route as needed for other. Use as instructed     HUMALOG 100 UNIT/ML injection INJECT 0.09 TO 0.15 ML(9  TO 15 UNITS TOTAL) UNDER THE SKIN THREE TIMES DAILY BEFORE MEALS 40 mL 2   insulin NPH Human (HUMULIN N,NOVOLIN N) 100 UNIT/ML injection Inject 11 Units into the skin 2 (two) times daily. Injecting 11-12 units in the morning and 18-22 units at night.      Jonathan current facility-administered medications on file prior to visit.   Jonathan Known Allergies Jonathan family history on file.  PE: BP 130/86 (BP Location: Right Arm, Jonathan Armstrong Position: Sitting, Cuff Size: Normal)   Pulse 91   Ht '5\' 9"'$  (1.753 m)   Wt 172 lb 9.6 oz (78.3 kg)   SpO2 99%   BMI 25.49 kg/m  Wt Readings from Last 3 Encounters:  05/24/22 172 lb 9.6 oz (78.3 kg)  01/11/22 167 lb 9.6 oz (76 kg)  08/31/21 171 lb 9.6 oz (77.8 kg)   Constitutional: normal weight, in NAD Eyes: EOMI, Jonathan exophthalmos ENT: Jonathan neck masses palpated, Jonathan cervical lymphadenopathy Cardiovascular: Tachycardia, RR, Jonathan MRG Respiratory: CTA B Musculoskeletal: Jonathan deformities Skin: moist, warm, Jonathan rashes Neurological: Jonathan tremor with outstretched hands  ASSESSMENT: 1. DM1, uncontrolled, with complications - PN - Diabetic foot ulcer - DR - History of seizures  2. Lack of insurance  PLAN:  1. Jonathan Armstrong with longstanding, uncontrolled, type 1 diabetes, on basal/bolus insulin regimen with NPH and Humalog.  Unfortunately, he does not have insurance and cannot afford more complex medication regimen.  He uses vials of NPH which he can obtain without prescription.  At last visit, he had a CGM but we could not download the tracings.  I reviewed these on his phone.  Sugars were dropping drastically overnight and they were increasing after every meal.  Upon questioning, sugars went very high after dinner and he took correction of Humalog, approximately 3 units,  after which sugars were dropping fairly abruptly with a nadir in the morning.  We reduced his NPH at night but also in the morning and he mentions that he was not able to take enough Humalog due to fear of dropping his sugars too much during the day.  We also discussed that he absolutely needed insulin before lunch, also.  I gave him a sliding scale.  He occasionally inject half units. -At today's visit, sugars appear to be fairly well-controlled Armstrong review of his CGM tracings on his phone.  I do not see as many details as I would if he shared the information with the clinic, but at the glans, sugars do not appear to stay elevated or drop under 70 in a consistent fashion, despite the fact that he did increase his NPH dose quite a bit from last visit: From 15 units to 22 units at night.  Since she does not have wild fluctuations in the blood sugars or drops overnight, we can continue with this dose.  Similarly, sugars appear to be fairly well-controlled after meals so for now I did not suggest a change in the Humalog dose.  He is still not taking Humalog before lunch and we discussed that he may need few units before a larger lunch (pizza, etc.). -I suggested to: Jonathan Armstrong Instructions  Please use the following regimen:  - NPH 9 units in am and 22 units at night - Humalog: Before B:  8 units Before L: 0 (may need to take 2-4 units) Before D: 8-10 units   Use the following SSI: 150-200: + 0.5 unit 201-250: + 1 units 251-300: + 2 units > 300: + 3 units  Please return  in 4 months.  - we checked his HbA1c: 6.8% (slightly higher) - advised to check sugars at different times of the day - 4x a day, rotating check times - advised for yearly eye exams >> he is UTD - return to clinic in 4 months  2. Lack of insurance -He is interested in an insulin pump, but this is not affordable for now -For now, he prefers to continue with NPH but we discussed that Lantus is also more affordable now.  At next visit  we can try Lantus or another long-acting insulin  Philemon Kingdom, MD PhD [facility] Endocrinology

## 2022-05-24 NOTE — Patient Instructions (Addendum)
Please use the following regimen:  - NPH 9 units in am and 22 units at night - Humalog: Before B:  8 units Before L: 0 (may need to take 2-4 units) Before D: 8-10 units   Use the following SSI: 150-200: + 0.5 unit 201-250: + 1 units 251-300: + 2 units > 300: + 3 units  Please return in 4 months.

## 2022-08-18 ENCOUNTER — Telehealth: Payer: Self-pay | Admitting: Family Medicine

## 2022-08-18 NOTE — Telephone Encounter (Signed)
Patient's mother called ask if you would take her son on as a new patient

## 2022-08-18 NOTE — Telephone Encounter (Signed)
Yes ok as NP.  Please find a 40 min slot for him as NP

## 2022-08-24 DIAGNOSIS — R339 Retention of urine, unspecified: Secondary | ICD-10-CM | POA: Insufficient documentation

## 2022-09-16 ENCOUNTER — Ambulatory Visit (INDEPENDENT_AMBULATORY_CARE_PROVIDER_SITE_OTHER): Payer: Self-pay | Admitting: Internal Medicine

## 2022-09-16 ENCOUNTER — Encounter: Payer: Self-pay | Admitting: Internal Medicine

## 2022-09-16 VITALS — BP 110/60 | HR 94 | Ht 69.0 in | Wt 171.2 lb

## 2022-09-16 DIAGNOSIS — E119 Type 2 diabetes mellitus without complications: Secondary | ICD-10-CM

## 2022-09-16 DIAGNOSIS — Z5989 Other problems related to housing and economic circumstances: Secondary | ICD-10-CM

## 2022-09-16 DIAGNOSIS — E1042 Type 1 diabetes mellitus with diabetic polyneuropathy: Secondary | ICD-10-CM

## 2022-09-16 DIAGNOSIS — Z5971 Insufficient health insurance coverage: Secondary | ICD-10-CM

## 2022-09-16 LAB — COMPREHENSIVE METABOLIC PANEL
ALT: 35 U/L (ref 0–53)
AST: 19 U/L (ref 0–37)
Albumin: 4.3 g/dL (ref 3.5–5.2)
Alkaline Phosphatase: 76 U/L (ref 39–117)
BUN: 12 mg/dL (ref 6–23)
CO2: 33 mEq/L — ABNORMAL HIGH (ref 19–32)
Calcium: 9.4 mg/dL (ref 8.4–10.5)
Chloride: 98 mEq/L (ref 96–112)
Creatinine, Ser: 0.98 mg/dL (ref 0.40–1.50)
GFR: 97.81 mL/min (ref >60.00)
Glucose, Bld: 233 mg/dL — ABNORMAL HIGH (ref 70–99)
Potassium: 4.5 mEq/L (ref 3.5–5.1)
Sodium: 138 mEq/L (ref 135–145)
Total Bilirubin: 0.7 mg/dL (ref 0.2–1.2)
Total Protein: 7.7 g/dL (ref 6.0–8.3)

## 2022-09-16 LAB — LIPID PANEL
Cholesterol: 179 mg/dL (ref 0–200)
HDL: 58.9 mg/dL (ref >39.00)
LDL Cholesterol: 101 mg/dL — ABNORMAL HIGH (ref 0–99)
NonHDL: 119.97
Total CHOL/HDL Ratio: 3
Triglycerides: 94 mg/dL (ref 0.0–149.0)
VLDL: 18.8 mg/dL (ref 0.0–40.0)

## 2022-09-16 LAB — POCT GLYCOSYLATED HEMOGLOBIN (HGB A1C): Hemoglobin A1C: 7 % — AB (ref 4.0–5.6)

## 2022-09-16 LAB — MICROALBUMIN / CREATININE URINE RATIO
Creatinine,U: 47.5 mg/dL
Microalb Creat Ratio: 1.5 mg/g (ref 0.0–30.0)
Microalb, Ur: <0.7 mg/dL (ref 0.0–1.9)

## 2022-09-16 LAB — TSH: TSH: 1.72 u[IU]/mL (ref 0.35–5.50)

## 2022-09-16 NOTE — Patient Instructions (Addendum)
Please use the following regimen:  - NPH 9 units in am and 20 units at night - Humalog: Before B:  6-7 units Before L: 0 (may need to take 2-4 units) Before D: 8-9 units (may need to split the bolus into 70% before the meal and 30% 2h after the meal (or can try regular insulin)  Use the following SSI: 150-200: + 0.5 unit 201-250: + 1 units 251-300: + 2 units > 300: + 3 units  Look into Lantus price.  Please return in 4 months.

## 2022-09-16 NOTE — Progress Notes (Addendum)
Patient ID: Jonathan Armstrong, male   DOB: 1985-06-14, 37 y.o.   MRN: [id number]   HPI: Jonathan Armstrong is a 37 y.o.-year-old male, initially referred by his PCP, Agapito Games, MD , returning for f/u for DM1, dx 36 (37 y/o), uncontrolled, with complications (peripheral neuropathy, diabetic foot ulcer, diabetic retinopathy, history of seizure x1 2/2 hypoglycemia). He moved to [place] in 2017 from [place], [place]. Last OV 4 months ago. He does not have medical insurance, however, he plans to get Medicaid within the next week.  Interim history: No blurry vision, nausea, chest pain. He as L kidney stones - will have surgery in 2 weeks. He is drinking 2 gallons a water a day.  Due to  this new condition, he qualifies for Medicaid and he hopes to get it next week. He had increased fatigue as he is not sleeping well.  He has chronic incipient insomnia. At today's visit, he has a CGM but he declines sharing the information with the clinic as he mentions that this drains his phone battery.  Reviewed history: He does not have insurance so he cannot afford long-acting insulin analogs, a CGM, or an insulin pump.   He may start working full-time in the near future and will get insurance when the new office is ready.  Reviewed HbA1c levels: Lab Results  Component Value Date   HGBA1C 6.8 (A) 05/24/2022   HGBA1C 6.7 (A) 01/11/2022   HGBA1C 8.4 (A) 08/31/2021   Pt was on - [employer] insulins: -NPH 15 units twice a day (tried to move second NPH dose at BT >> sugars higher in am) -Regular insulin/Humalog 12-15 units twice a day with B and D as he drops sugars if takes this with L.  Now on: - NPH 9 units in am and 22 >> 20 units at night - Humalog: Before B:  8 >> 6-7 units Before L: 0 (try to take 2-4 units) Before D: (8)-10 >> 8-9 units  +SSI: 150-200: + 0.5 unit 201-250: + 1 units 251-300: + 2 units > 300: + 3 units Meter: AccuCheck  He was checking sugars inconsistently before >> now has a  [employer] CGM - checks >4x a day.  Trends were reviewed on his phone. - am:  45, 50s, 60s, 80-120 >> 100-120 >> ? >> 90-120 >> 150 - 2h after b'fast: n/c >> 327 >> n/c - before lunch:  may decrease to 32-50s >> 100-120 (occas. after a snack) - 2h after lunch: n/c - before dinner: 100-180 >> 70-80s, mostly 150-170s >> ? >> 80-100 >> 80 - 2h after dinner: n/c >> n/c >> 120-180 - bedtime: n/c >> 150-160 >> n/c >> 120s >> ? >> 130-200 >> 180-200 - nighttime: n/c He does have lows.  Lowest sugar was 17 (!) >> seizure 2018 ... >>  32 x1 >> 44 >> 50s >> 45 >> 46; he has hypoglycemia awareness in the 60s.   No previous hypoglycemia admission.   He wanted to pay out-of-pocket for Baqsimi.  I sent the prescription to his pharmacy.  I also gave him a coupon card. Highest sugar was 670 long time ago; more recently: 400 >> 320 >> 300.  No previous DKA admissions.    Pt's meals are: - Breakfast: 2/3 bowl oatmeal, butter, salt  - Lunch: PBJ sandwich or a bowl of noodles (0 units) - Dinner: meat + veggies+ starch (45-60 CHO) - Snacks: crackers She saw nutrition 2006-2008.  -No CKD, last BUN/creatinine:  Lab Results  Component Value Date   BUN 12 08/31/2021   BUN 14 04/28/2020   CREATININE 0.90 08/31/2021   CREATININE 0.92 04/28/2020  He had urinary tract infections after his diabetes diagnosis, but these resolved.  -No HL; last set of lipids: Lab Results  Component Value Date   CHOL 177 08/31/2021   HDL 63.80 08/31/2021   LDLCALC 99 08/31/2021   TRIG 72.0 08/31/2021   CHOLHDL 3 08/31/2021    - last eye exam was in 2023: + DR.  He gets intraocular injections.  Dr. Carmela Rima (who also his boss).  He had surgery for removal of retinal scarring 2018.  -+ Numbness in feet. Last foot exam: 08/31/2021  Latest TSH was reviewed: Lab Results  Component Value Date   TSH 1.95 08/31/2021   Pt has FH of DM1 in Paternal Great uncle. MGF and MGM had DM2.  He would not want to have  children to avoid the risk of getting type 1 diabetes.  ROS: + See HPI  I reviewed pt's medications, allergies, PMH, social hx, family hx, and changes were documented in the history of present illness. Otherwise, unchanged from my initial visit note.  Past Medical History:  Diagnosis Date   Chronic cough    Diabetic retinopathy associated with type 1 diabetes mellitus (HCC) 02/14/2017   Seizure (HCC)    Type 1 diabetes mellitus (HCC)    Past Surgical History:  Procedure Laterality Date   EYE SURGERY     Social History   Socioeconomic History   Marital status: Single    Spouse name: Not on file   Number of children: 0    Years of education: Not on file   Highest education level: Not on file  Occupational History    IT/software development  Social Needs   Financial resource strain: Not on file   Food insecurity:    Worry: Not on file    Inability: Not on file   Transportation needs:    Medical: Not on file    Non-medical: Not on file  Tobacco Use   Smoking status: Never Smoker   Smokeless tobacco: Never Used  Substance and Sexual Activity   Alcohol use: No   Drug use: Never   Sexual activity: Not Currently  Lifestyle   Physical activity:    Days per week: Not on file    Minutes per session: Not on file   Stress: Not on file  Relationships   Social connections:    Talks on phone: Not on file    Gets together: Not on file    Attends religious service: Not on file    Active member of club or organization: Not on file    Attends meetings of clubs or organizations: Not on file    Relationship status: Not on file   Intimate partner violence:    Fear of current or ex partner: Not on file    Emotionally abused: Not on file    Physically abused: Not on file    Forced sexual activity: Not on file  Other Topics Concern   Not on file  Social History Narrative   Not on file   Current Outpatient Medications on File Prior to Visit  Medication Sig Dispense Refill    Continuous Blood Gluc Sensor (FREESTYLE LIBRE 3 SENSOR) MISC Use 1 sensor every 14 days. 6 each 2   fluticasone (FLONASE) 50 MCG/ACT nasal spray Place 1 spray into both nostrils at bedtime. 16 g 1   Glucagon (  BAQSIMI ONE PACK) 3 MG/DOSE POWD Use 1x as needed for hypoglycemia 1 each 11   glucose blood test strip 1 each by Other route as needed for other. Use as instructed     insulin lispro (HUMALOG) 100 UNIT/ML injection INJECT 0.09 TO 0.15 ML(9 TO 15 UNITS TOTAL) UNDER THE SKIN THREE TIMES DAILY BEFORE MEALS 40 mL 3   insulin NPH Human (HUMULIN N,NOVOLIN N) 100 UNIT/ML injection Inject 9-22 Units into the skin 2 (two) times daily. Injecting 9 units in the morning and 22 units at night.     No current facility-administered medications on file prior to visit.   No Known Allergies No family history on file.  PE: BP 110/60   Pulse 94   Ht 5\' 9"  (1.753 m)   Wt 171 lb 3.2 oz (77.7 kg)   SpO2 99%   BMI 25.28 kg/m  Wt Readings from Last 3 Encounters:  09/16/22 171 lb 3.2 oz (77.7 kg)  05/24/22 172 lb 9.6 oz (78.3 kg)  01/11/22 167 lb 9.6 oz (76 kg)   Constitutional: normal weight, in NAD Eyes: EOMI, no exophthalmos ENT: no neck masses palpated, no cervical lymphadenopathy Cardiovascular: tachycardia, RR, No MRG Respiratory: CTA B Musculoskeletal: no deformities Skin: no rashes Neurological: no tremor with outstretched hands Diabetic Foot Exam - Simple   Simple Foot Form Diabetic Foot exam was performed with the following findings: Yes 09/16/2022 10:09 AM  Visual Inspection No deformities, no ulcerations, no other skin breakdown bilaterally: Yes Sensation Testing See comments: Yes Pulse Check Posterior Tibialis and Dorsalis pulse intact bilaterally: Yes Comments Decreased sensation to monofilament B forefeet     ASSESSMENT: 1. DM1, uncontrolled, with complications - PN - Diabetic foot ulcer - DR - History of seizures  2. Lack of insurance  PLAN:  1. Patient with  longstanding, uncontrolled, type 1 diabetes, basal/bolus insulin regimen with NPH and Humalog due to lack of insurance.  He cannot afford a more complex medication regimen.  He uses vials of NPH which he can obtain without prescription.  He did not want to share the information with the clinic so we could not download his CGM and I reviewed his tracings on his phone at last visit.  Sugars are fairly well-controlled without particular trends so we continued the same regimen.  I did advise him to take a few units of Humalog before a larger lunch as he was not taking any insulin before this meal.  HbA1c at last visit was 6.8%, slightly higher. -At today's visit, sugars are higher in the morning as he had to reduce the dose of NPH due to lows at night.  We discussed again about trying Lantus which is now much cheaper.  I advised him again to look into this.  However, he is hoping to get Medicaid next week and we can change to Lantus afterwards. -He noticed a trend of blood sugars staying control after dinner, but increasing afterwards, at bedtime, despite no snacking.  We discussed about the possibility of a fatty dinner and contributing to higher blood sugars later.  I advised him that in this situation, he may need to do a smaller bolus of insulin later, after dinner, and another option would be to use regular insulin before this meal, since this peaks later.  We continued the regimen otherwise. -I suggested to: Patient Instructions  Please use the following regimen:  - NPH 9 units in am and 20 units at night - Humalog: Before B:  6-7 units  Before L: 0 (may need to take 2-4 units) Before D: 8-9 units (may need to split the bolus into 70% before the meal and 30% 2h after the meal - or can try regular insulin)  Use the following SSI: 150-200: + 0.5 unit 201-250: + 1 units 251-300: + 2 units > 300: + 3 units  Look into Lantus price.  Please return in 4 months.  - we checked his HbA1c: 7.0% (slightly  higher) - advised to check sugars at different times of the day - 4x a day, rotating check times - advised for yearly eye exams >> he is UTD - he is due for annual labs -will check these today - return to clinic in 4 months  2. Lack of insurance -He is interested in an insulin pump but this is not affordable for now. -We discussed at last visit about switching to an analog insulin, Lantus, since this is more affordable for now.  He wanted to continue NPH at that time.  At today's visit, I again advised him to do so and he may do this after switching to Medicaid.  I advised him to let me know.  Component     Latest Ref Rng 09/16/2022  Glucose     70 - 99 mg/dL 098 (H)   BUN     6 - 23 mg/dL 12   Creatinine     1.19 - 1.50 mg/dL 1.47   Sodium     829 - 145 mEq/L 138   Potassium     3.5 - 5.1 mEq/L 4.5   Chloride     96 - 112 mEq/L 98   CO2     19 - 32 mEq/L 33 (H)   Calcium     8.4 - 10.5 mg/dL 9.4   Total Protein     6.0 - 8.3 g/dL 7.7   Total Bilirubin     0.2 - 1.2 mg/dL 0.7   AST     0 - 37 U/L 19   ALT     0 - 53 U/L 35   Cholesterol     0 - 200 mg/dL 562   Triglycerides     0.0 - 149.0 mg/dL 13.0   HDL Cholesterol     >39.00 mg/dL 86.57   VLDL     0.0 - 40.0 mg/dL 84.6   LDL (calc)     0 - 99 mg/dL 962 (H)   Total CHOL/HDL Ratio 3   NonHDL 119.97   TSH     0.35 - 5.50 uIU/mL 1.72   Alkaline Phosphatase     39 - 117 U/L 76   Albumin     3.5 - 5.2 g/dL 4.3   GFR     >95.28 mL/min 97.81   Microalb, Ur     0.0 - 1.9 mg/dL <4.1   Creatinine,U     mg/dL 32.4   MICROALB/CREAT RATIO     0.0 - 30.0 mg/g 1.5   Labs are at goal with the exception of a high glucose and a slightly higher LDL.  Carlus Pavlov, MD PhD [facility]

## 2022-09-20 ENCOUNTER — Ambulatory Visit: Payer: Self-pay | Admitting: Internal Medicine

## 2022-10-12 ENCOUNTER — Ambulatory Visit: Payer: Self-pay | Admitting: Family Medicine

## 2022-11-04 ENCOUNTER — Ambulatory Visit (INDEPENDENT_AMBULATORY_CARE_PROVIDER_SITE_OTHER): Payer: Medicaid Other | Admitting: Family Medicine

## 2022-11-04 ENCOUNTER — Encounter: Payer: Self-pay | Admitting: Family Medicine

## 2022-11-04 ENCOUNTER — Ambulatory Visit (INDEPENDENT_AMBULATORY_CARE_PROVIDER_SITE_OTHER): Payer: Medicaid Other

## 2022-11-04 VITALS — BP 132/78 | HR 78 | Ht 69.0 in | Wt 178.0 lb

## 2022-11-04 DIAGNOSIS — E1042 Type 1 diabetes mellitus with diabetic polyneuropathy: Secondary | ICD-10-CM

## 2022-11-04 DIAGNOSIS — G8929 Other chronic pain: Secondary | ICD-10-CM | POA: Diagnosis not present

## 2022-11-04 DIAGNOSIS — Z87442 Personal history of urinary calculi: Secondary | ICD-10-CM | POA: Insufficient documentation

## 2022-11-04 DIAGNOSIS — E10319 Type 1 diabetes mellitus with unspecified diabetic retinopathy without macular edema: Secondary | ICD-10-CM | POA: Diagnosis not present

## 2022-11-04 DIAGNOSIS — M25571 Pain in right ankle and joints of right foot: Secondary | ICD-10-CM

## 2022-11-04 DIAGNOSIS — Z23 Encounter for immunization: Secondary | ICD-10-CM | POA: Diagnosis not present

## 2022-11-04 NOTE — Assessment & Plan Note (Signed)
Follows with Endocrinology.   

## 2022-11-04 NOTE — Assessment & Plan Note (Signed)
Currently on Flomax.  Has f/u with URology.

## 2022-11-04 NOTE — Progress Notes (Signed)
New Patient Office Visit  Subjective    Patient ID: Jonathan Armstrong, male    DOB: 06-26-1985  Age: 37 y.o. MRN: [id number]  CC:  Chief Complaint  Patient presents with   Establish Care         HPI Jonathan Armstrong presents to establish care. He used to see Jonathan Armstrong.   He is the son of Jonathan Armstrong.    Has hx of kidney stones and hs been seeing Dr. Leanord Asal.just had litotripsy and sten placed on 7/2  Follow with Dr. Ernest Haber with [facility] endocrinology. He his type 1 diabetes and has a GCM.   Also having right ankle pain for 18 months. Twisted his ankle around that time and didn't have an evaluation.  Is painful and sore at time. Wears a brace occassionally.  No swelling. Mom says as a child turned his feet out and had an abnormal gate.     Outpatient Encounter Medications as of 11/04/2022  Medication Sig   tamsulosin (FLOMAX) 0.4 MG CAPS capsule Take 0.4 mg by mouth daily.   Continuous Blood Gluc Sensor (FREESTYLE LIBRE 3 SENSOR) MISC Use 1 sensor every 14 days.   fluticasone (FLONASE) 50 MCG/ACT nasal spray Place 1 spray into both nostrils at bedtime.   Glucagon (BAQSIMI ONE PACK) 3 MG/DOSE POWD Use 1x as needed for hypoglycemia   glucose blood test strip 1 each by Other route as needed for other. Use as instructed   insulin lispro (HUMALOG) 100 UNIT/ML injection INJECT 0.09 TO 0.15 ML(9 TO 15 UNITS TOTAL) UNDER THE SKIN THREE TIMES DAILY BEFORE MEALS   insulin NPH Human (HUMULIN N,NOVOLIN N) 100 UNIT/ML injection Inject 9-22 Units into the skin 2 (two) times daily. Injecting 9 units in the morning and 22 units at night.   No facility-administered encounter medications on file as of 11/04/2022.    Past Medical History:  Diagnosis Date   Chronic cough    Diabetic retinopathy associated with type 1 diabetes mellitus (HCC) 02/14/2017   Seizure (HCC)    Type 1 diabetes mellitus (HCC)     Past Surgical History:  Procedure Laterality Date   EYE SURGERY       History reviewed. No pertinent family history.  Social History   Socioeconomic History   Marital status: Single    Spouse name: Not on file   Number of children: Not on file   Years of education: Not on file   Highest education level: Not on file  Occupational History   Not on file  Tobacco Use   Smoking status: Never   Smokeless tobacco: Never  Vaping Use   Vaping status: Never Used  Substance and Sexual Activity   Alcohol use: No   Drug use: Never   Sexual activity: Not Currently  Other Topics Concern   Not on file  Social History Narrative   Not on file   Social Determinants of Health   Financial Resource Strain: Not on file  Food Insecurity: Not on file  Transportation Needs: Not on file  Physical Activity: Not on file  Stress: Not on file  Social Connections: Unknown (08/18/2022)   Received from [facility]   Social Network    Social Network: Not on file  Intimate Partner Violence: Not At Risk (08/18/2022)   Received from [facility]   HITS    Over the last 12 months how often did your partner physically hurt you?: 1    Over the  last 12 months how often did your partner insult you or talk down to you?: 1    Over the last 12 months how often did your partner threaten you with physical harm?: 1    Over the last 12 months how often did your partner scream or curse at you?: 1    ROS      Objective    BP 132/78   Pulse 78   Ht 5\' 9"  (1.753 m)   Wt 178 lb (80.7 kg)   SpO2 98%   BMI 26.29 kg/m   Physical Exam      Assessment & Plan:   Problem List Items Addressed This Visit       Endocrine   Type 1 diabetes mellitus with diabetic polyneuropathy (HCC) - Primary    Follows with Endocrinology.        Diabetic retinopathy associated with type 1 diabetes mellitus (HCC)   Diabetic polyneuropathy associated with type 1 diabetes mellitus (HCC)     Other   History of kidney stones    Currently on Flomax.  Has  f/u with [facility]ology.       Chronic pain of right ankle   Relevant Orders   DG Ankle Complete Right   Other Visit Diagnoses     Encounter for immunization       Relevant Orders   Tdap vaccine greater than or equal to 7yo IM (Completed)      Right ankle pain - will get x-rays and refer to Dr. Karie Schwalbe.  He would benefit greatly from some physical therapy either at home or formally to strengthen the ankle since he does feel like it has been a little loose feeling.  He does not have any loss of range of motion.  He might also benefit from having some extra support under his feet such as custom orthotics etc.  Tdap and flu vaccines given today.    Return if symptoms worsen or fail to improve.   Nani Gasser, MD

## 2022-11-05 NOTE — Progress Notes (Signed)
Hi Jonathan Armstrong, it looks like you actually did fracture it initially.  They can see some callus formation where it tried to heal but it is slightly displaced.  So I definitely want a get you in with our sports med doc, Dr. Benjamin Stain to discuss options to improve your pain and function.

## 2022-11-08 NOTE — Addendum Note (Signed)
Addended by: Pollie Meyer on: 11/08/2022 09:14 AM   Modules accepted: Orders

## 2023-01-17 ENCOUNTER — Ambulatory Visit (INDEPENDENT_AMBULATORY_CARE_PROVIDER_SITE_OTHER): Payer: Medicaid Other | Admitting: Internal Medicine

## 2023-01-17 ENCOUNTER — Encounter: Payer: Self-pay | Admitting: Internal Medicine

## 2023-01-17 VITALS — BP 120/70 | HR 105 | Ht 69.0 in | Wt 173.0 lb

## 2023-01-17 DIAGNOSIS — Z5971 Insufficient health insurance coverage: Secondary | ICD-10-CM | POA: Diagnosis not present

## 2023-01-17 DIAGNOSIS — Z794 Long term (current) use of insulin: Secondary | ICD-10-CM

## 2023-01-17 DIAGNOSIS — E1042 Type 1 diabetes mellitus with diabetic polyneuropathy: Secondary | ICD-10-CM | POA: Diagnosis not present

## 2023-01-17 LAB — POCT GLYCOSYLATED HEMOGLOBIN (HGB A1C): Hemoglobin A1C: 7.2 % — AB (ref 4.0–5.6)

## 2023-01-17 MED ORDER — LANTUS SOLOSTAR 100 UNIT/ML ~~LOC~~ SOPN: 25.0000 [IU] | PEN_INJECTOR | Freq: Every day | SUBCUTANEOUS | 3 refills | Status: DC

## 2023-01-17 MED ORDER — FREESTYLE LIBRE 3 PLUS SENSOR MISC: 1.0000 | 3 refills | Status: DC

## 2023-01-17 NOTE — Patient Instructions (Addendum)
Please stop NPH and start: - Lantus 25 units at night  Use: - Humalog - 15 min before meals: Before B:  6-7 units Before L: 2-4 units Before D: 8-9 units  - Humalog sliding scale: 150-200: + 0.5 unit 201-250: + 1 units 251-300: + 2 units > 300: + 3 units  Please return in 4 months.

## 2023-01-17 NOTE — Addendum Note (Signed)
Addended by: Pollie Meyer on: 01/17/2023 11:40 AM   Modules accepted: Orders

## 2023-01-17 NOTE — Progress Notes (Signed)
Patient ID: Jonathan Armstrong, male   DOB: 11/24/1985, 37 y.o.   MRN: [id number]   HPI: Jonathan Armstrong is a 37 y.o.-year-old male, initially referred by his PCP, Jonathan Games, MD , returning for f/u for DM1, dx 71 (37 y/o), uncontrolled, with complications (peripheral neuropathy, diabetic foot ulcer, diabetic retinopathy, history of seizure x1 2/2 hypoglycemia). He moved to [place] in 2017 from [place], [place]. Last OV 4 months ago. Since last visit, he got Medicaid insurance.  Interim history: No blurry vision, nausea, chest pain.  He has fatigue and insomnia. He has been w/o a CGM sensor for 1 mo - backorder.   Reviewed history: He does not have insurance so he cannot afford long-acting insulin analogs, a CGM, or an insulin pump.    Reviewed HbA1c levels: Lab Results  Component Value Date   HGBA1C 7.0 (A) 09/16/2022   HGBA1C 6.8 (A) 05/24/2022   HGBA1C 6.7 (A) 01/11/2022   Pt was on - ReliOn insulins: -NPH 15 units twice a day (tried to move second NPH dose at BT >> sugars higher in am) -Regular insulin/Humalog 12-15 units twice a day with B and D as he drops sugars if takes this with L.  Now on: - NPH 9 >> 8 units in am and 22 >> 20 >> 17-22 units at night - Humalog: Before B:  6-7 units Before L: 0 (may need to take 2-4 units) Before D: 8-9 units (may need to split the bolus into 70% before the meal and 30% 2h after the meal - or can try regular insulin) - Humalog SSI: 150-200: + 0.5 unit 201-250: + 1 units 251-300: + 2 units > 300: + 3 units Meter: AccuCheck  He checks his blood sugars with a [employer] Libre 3 CGM:  Prev.: - am:   100-120 >> ? >> 90-120 >> 150 >> 100-160 - 2h after b'fast: n/c >> 327 >> n/c - before lunch: 32-50s >> 100-120  - 2h after lunch: n/c - before dinner: 150-170s >> ? >> 80-100 >> 80 - 2h after dinner: n/c >> n/c >> 120-180 - bedtime:  ? >> 130-200 >> 180-200 >> 100-180 - nighttime: n/c He does have lows.  Lowest sugar was 17 (!) >>  seizure 2018 ... >> 32 x1 ... >> 46; he has hypoglycemia awareness in the 60s.   No previous hypoglycemia admission.   He wanted to pay out-of-pocket for Baqsimi.  I sent the prescription to his pharmacy.  I also gave him a coupon card. Highest sugar was 670 long time ago; more recently: 400 >> 320 >> 300.  No previous DKA admissions.    Pt's meals are: - Breakfast: 2/3 bowl oatmeal, butter, salt  - Lunch: PBJ sandwich or a bowl of noodles (0 units) - Dinner: meat + veggies+ starch (45-60 CHO) - Snacks: crackers She saw nutrition 2006-2008.  -No CKD, last BUN/creatinine:  Lab Results  Component Value Date   BUN 12 09/16/2022   BUN 12 08/31/2021   CREATININE 0.98 09/16/2022   CREATININE 0.90 08/31/2021   Lab Results  Component Value Date   MICRALBCREAT 1.5 09/16/2022   MICRALBCREAT 1.6 08/31/2021   MICRALBCREAT <30 02/14/2017  He had urinary tract infections after his diabetes diagnosis, but these resolved.  -No HL; last set of lipids: Lab Results  Component Value Date   CHOL 179 09/16/2022   HDL 58.90 09/16/2022   LDLCALC 101 (H) 09/16/2022   TRIG 94.0 09/16/2022   CHOLHDL 3 09/16/2022    -  last eye exam was 09/04/2022: + DR reportedly.  He gets intraocular injections.  Jonathan Armstrong (who also his boss).  He had surgery for removal of retinal scarring 2018.  -+ Numbness in feet. Last foot exam: 09/16/2022.  Latest TSH was reviewed: Lab Results  Component Value Date   TSH 1.72 09/16/2022   He has a history of kidney stones.  He had cystoscopy 09/2022.  Pt has FH of DM1 in Paternal Great uncle. MGF and MGM had DM2. He would not want to have children to avoid the risk of getting type 1 diabetes.  ROS: + See HPI  I reviewed pt's medications, allergies, PMH, social hx, family hx, and changes were documented in the history of present illness. Otherwise, unchanged from my initial visit note.  Past Medical History:  Diagnosis Date   Chronic cough    Diabetic  retinopathy associated with type 1 diabetes mellitus (HCC) 02/14/2017   Seizure (HCC)    Type 1 diabetes mellitus (HCC)    Past Surgical History:  Procedure Laterality Date   EYE SURGERY     Social History   Socioeconomic History   Marital status: Single    Spouse name: Not on file   Number of children: 0    Years of education: Not on file   Highest education level: Not on file  Occupational History    IT/software development  Social Needs   Financial resource strain: Not on file   Food insecurity:    Worry: Not on file    Inability: Not on file   Transportation needs:    Medical: Not on file    Non-medical: Not on file  Tobacco Use   Smoking status: Never Smoker   Smokeless tobacco: Never Used  Substance and Sexual Activity   Alcohol use: No   Drug use: Never   Sexual activity: Not Currently  Lifestyle   Physical activity:    Days per week: Not on file    Minutes per session: Not on file   Stress: Not on file  Relationships   Social connections:    Talks on phone: Not on file    Gets together: Not on file    Attends religious service: Not on file    Active member of club or organization: Not on file    Attends meetings of clubs or organizations: Not on file    Relationship status: Not on file   Intimate partner violence:    Fear of current or ex partner: Not on file    Emotionally abused: Not on file    Physically abused: Not on file    Forced sexual activity: Not on file  Other Topics Concern   Not on file  Social History Narrative   Not on file   Current Outpatient Medications on File Prior to Visit  Medication Sig Dispense Refill   Continuous Blood Gluc Sensor (FREESTYLE LIBRE 3 SENSOR) MISC Use 1 sensor every 14 days. 6 each 2   fluticasone (FLONASE) 50 MCG/ACT nasal spray Place 1 spray into both nostrils at bedtime. 16 g 1   Glucagon (BAQSIMI ONE PACK) 3 MG/DOSE POWD Use 1x as needed for hypoglycemia 1 each 11   glucose blood test strip 1 each by  Other route as needed for other. Use as instructed     insulin lispro (HUMALOG) 100 UNIT/ML injection INJECT 0.09 TO 0.15 ML(9 TO 15 UNITS TOTAL) UNDER THE SKIN THREE TIMES DAILY BEFORE MEALS 40 mL 3   insulin  NPH Human (HUMULIN N,NOVOLIN N) 100 UNIT/ML injection Inject 9-22 Units into the skin 2 (two) times daily. Injecting 9 units in the morning and 22 units at night.     tamsulosin (FLOMAX) 0.4 MG CAPS capsule Take 0.4 mg by mouth daily.     No current facility-administered medications on file prior to visit.   No Known Allergies No family history on file.  PE: BP 120/70   Pulse (!) 105   Ht 5\' 9"  (1.753 m)   Wt 173 lb (78.5 kg)   SpO2 97%   BMI 25.55 kg/m  Wt Readings from Last 3 Encounters:  01/17/23 173 lb (78.5 kg)  11/04/22 178 lb (80.7 kg)  09/16/22 171 lb 3.2 oz (77.7 kg)   Constitutional: normal weight, in NAD Eyes: EOMI, no exophthalmos ENT: no neck masses palpated, no cervical lymphadenopathy Cardiovascular: tachycardia, RR, No MRG Respiratory: CTA B Musculoskeletal: no deformities Skin: no rashes Neurological: no tremor with outstretched hands  ASSESSMENT: 1. DM1, uncontrolled, with complications - PN - Diabetic foot ulcer - DR - History of seizures  2. Lack of insurance  PLAN:  1. Patient with longstanding, uncontrolled, type 1 diabetes, on basal/bolus insulin regimen with NPH and Humalog due to lack of insurance in the past.  However, since last visit, he switched to Medicaid. -At last visit, sugars are higher in the morning as he had to reduce the dose of NPH due to lows at night.  At today's visit, we will try to switch to Lantus. -At last visit, he noticed a trend of blood sugars staying control after dinner, but increasing afterwards, at bedtime, despite no snacking.  We discussed about the possibility of a fatty dinner and contributing to higher blood sugars later.  I advised him that in this situation, he may need to do a smaller bolus of insulin  later, after dinner, and another option would be to use regular insulin before this meal, since this peaks later.  We continued the regimen otherwise. CGM Interpretation: -At today's visit, we reviewed his CGM downloads: It appears that 64% of values are in target range (goal >70%), while 35% are higher than 180 (goal <25%), and 1% are lower than 70 (goal <4%).  The calculated average blood sugar is 167.  The projected HbA1c for the next 3 months ([facility]) is 7.3%. -Reviewing the CGM trends, sugars are fluctuating within the target range but increasing significantly overnight, peaking around 6 to 7 AM.  He also has abrupt hyperglycemic spikes especially around midday and in the afternoon.  Upon questioning, he believes that these are due to high glycemic index meals.  He did start to take his Humalog with him and he boluses 2 to 3 units before lunch.  I advised him to vary the dose between 2-4 units. -Upon questioning, when sugars are at goal or slightly low, down to 100 at night, he eats a snack and I believe that his sugars are increasing overnight as a consequence of this.  I advised him to try to skip the snack.  I also recommended to switch from NPH to Lantus, which is now $4 for him for the new insurance.  We decided to start 25 units at night.  We may need to split the dose in the future.  I advised him to only use 20 units tonight and then increase the dose tomorrow night. -He would like to get a prescription for the freestyle libre 3+, since the 3 sensor is on backorder -I  suggested to: Patient Instructions  Please stop NPH and start: - Lantus 25 units at night  Use: - Humalog - 15 min before meals: Before B:  6-7 units Before L: 2-4 units Before D: 8-9 units  - Humalog sliding scale: 150-200: + 0.5 unit 201-250: + 1 units 251-300: + 2 units > 300: + 3 units  Please return in 4 months.  - we checked his HbA1c: 7.2% - higher - advised to check sugars at different times of the day - 4x a  day, rotating check times - advised for yearly eye exams >> he is UTD - return to clinic in 4 months  2. Lack of insurance - resolved -He was interested in an insulin pump but this was not affordable for him -We discussed at the last few visits about switching to an analog insulin, Lantus, since the price was capped recently.  However, he wanted to hold off until switching to Medicaid.  He is now on Medicaid so we will go ahead and start Lantus now  Jonathan Pavlov, MD PhD [facility] Endocrinology

## 2023-01-19 ENCOUNTER — Other Ambulatory Visit: Payer: Self-pay

## 2023-01-19 DIAGNOSIS — E1042 Type 1 diabetes mellitus with diabetic polyneuropathy: Secondary | ICD-10-CM

## 2023-01-19 MED ORDER — FREESTYLE LIBRE 3 PLUS SENSOR MISC: 1.0000 | 3 refills | Status: DC

## 2023-01-19 NOTE — Telephone Encounter (Signed)
Requested Prescriptions   Signed Prescriptions Disp Refills   Continuous Glucose Sensor (FREESTYLE LIBRE 3 PLUS SENSOR) MISC 6 each 3    Sig: Inject 1 Device into the skin continuous. Change every 15 days    Authorizing Provider: Carlus Pavlov    Ordering User: Pollie Meyer

## 2023-02-02 ENCOUNTER — Encounter: Payer: Self-pay | Admitting: Internal Medicine

## 2023-02-03 ENCOUNTER — Other Ambulatory Visit: Payer: Self-pay

## 2023-02-03 DIAGNOSIS — E1042 Type 1 diabetes mellitus with diabetic polyneuropathy: Secondary | ICD-10-CM

## 2023-02-03 MED ORDER — FREESTYLE LIBRE 3 PLUS SENSOR MISC: 1.0000 | 3 refills | Status: DC

## 2023-04-03 ENCOUNTER — Other Ambulatory Visit: Payer: Self-pay | Admitting: Internal Medicine

## 2023-05-09 ENCOUNTER — Ambulatory Visit (INDEPENDENT_AMBULATORY_CARE_PROVIDER_SITE_OTHER): Payer: Medicaid Other | Admitting: Internal Medicine

## 2023-05-09 ENCOUNTER — Encounter: Payer: Self-pay | Admitting: Internal Medicine

## 2023-05-09 VITALS — BP 110/64 | HR 100 | Ht 69.0 in | Wt 171.2 lb

## 2023-05-09 DIAGNOSIS — E663 Overweight: Secondary | ICD-10-CM | POA: Diagnosis not present

## 2023-05-09 DIAGNOSIS — E1042 Type 1 diabetes mellitus with diabetic polyneuropathy: Secondary | ICD-10-CM | POA: Diagnosis not present

## 2023-05-09 DIAGNOSIS — Z794 Long term (current) use of insulin: Secondary | ICD-10-CM | POA: Diagnosis not present

## 2023-05-09 LAB — POCT GLYCOSYLATED HEMOGLOBIN (HGB A1C): Hemoglobin A1C: 7.4 % — AB (ref 4.0–5.6)

## 2023-05-09 MED ORDER — FREESTYLE LIBRE 3 PLUS SENSOR MISC
1.0000 | 3 refills | Status: AC
Start: 2023-05-09 — End: ?

## 2023-05-09 NOTE — Progress Notes (Signed)
 Patient ID: Jonathan Armstrong, male   DOB: 1985/12/11, 38 y.o.   MRN: [id number]   HPI: Jonathan Armstrong is a 38 y.o.-year-old male, initially referred by his PCP, Cydney Draft, MD , returning for f/u for DM1, dx 34 (38 y/o), uncontrolled, with complications (peripheral neuropathy, diabetic foot ulcer, diabetic retinopathy, history of seizure x1 2/2 hypoglycemia). He moved to [place] in 2017 from [place], [place]. Last OV 4 months ago.  Interim history: No blurry vision, nausea, chest pain with urination.  Reviewed history: He previously did not have insurance so he cannot afford long-acting insulin  analogs, a CGM, or an insulin  pump.  He got Medicaid in 2024.  Reviewed HbA1c levels: Lab Results  Component Value Date   HGBA1C 7.2 (A) 01/17/2023   HGBA1C 7.0 (A) 09/16/2022   HGBA1C 6.8 (A) 05/24/2022   Pt was on - ReliOn insulins: -NPH 15 units twice a day (tried to move second NPH dose at BT >> sugars higher in am) -Regular insulin /Humalog  12-15 units twice a day with B and D as he drops sugars if takes this with L.  Now on: - NPH 9 >> 8 units in am and 22 >> 20 >> 17-22 units at night >> Lantus  25 >> 26 units at night - Humalog : Before B:  6-7 units Before L: 0 >> 2-4 units Before D: 8-9 units (may need to split the bolus into 70% before the meal and 30% 2h after the meal - or can try regular insulin ) - Humalog  SSI: 150-200: + 0.5 unit 201-250: + 1 units 251-300: + 2 units > 300: + 3 units Meter: AccuCheck  He checks his blood sugars with the [employer] CGM:  Prev.:   He does have lows.  Lowest sugar was 17 (!) >> seizure 2018 ... >> 32 x1 ... >> 46 >> 40s x1; he has hypoglycemia awareness in the 60s.   No previous hypoglycemia admission.   He wanted to pay out-of-pocket for Baqsimi .  I sent the prescription to his pharmacy.  I also gave him a coupon card. Highest sugar was 670 long time ago; more recently: 400 >> 320 >> 300 >> 350.  No previous DKA admissions.    Pt's  meals are: - Breakfast: 2/3 bowl oatmeal, butter, salt  - Lunch: PBJ sandwich or a bowl of noodles (0 units) - Dinner: meat + veggies+ starch (45-60 CHO) - Snacks: crackers She saw nutrition 2006-2008.  -No CKD, last BUN/creatinine:  Lab Results  Component Value Date   BUN 12 09/16/2022   BUN 12 08/31/2021   CREATININE 0.98 09/16/2022   CREATININE 0.90 08/31/2021   Lab Results  Component Value Date   MICRALBCREAT 1.5 09/16/2022   MICRALBCREAT 1.6 08/31/2021   MICRALBCREAT <30 02/14/2017  He had urinary tract infections after his diabetes diagnosis, but these resolved.  -No HL; last set of lipids: Lab Results  Component Value Date   CHOL 179 09/16/2022   HDL 58.90 09/16/2022   LDLCALC 101 (H) 09/16/2022   TRIG 94.0 09/16/2022   CHOLHDL 3 09/16/2022    - last eye exam was 09/04/2022: + DR reportedly.  He gets intraocular injections.  Dr. Jearline Minder (who also his boss).  He had surgery for removal of retinal scarring 2018.  -+ Numbness in feet. Last foot exam: 09/16/2022.  Latest TSH was reviewed: Lab Results  Component Value Date   TSH 1.72 09/16/2022   He has a history of kidney stones.  He had cystoscopy 09/2022.  Pt has  FH of DM1 in Paternal Great uncle. MGF and MGM had DM2. He would not want to have children to avoid the risk of getting type 1 diabetes.  ROS: + See HPI  I reviewed pt's medications, allergies, PMH, social hx, family hx, and changes were documented in the history of present illness. Otherwise, unchanged from my initial visit note. Past Medical History:  Diagnosis Date   Chronic cough    Diabetic retinopathy associated with type 1 diabetes mellitus (HCC) 02/14/2017   Seizure (HCC)    Type 1 diabetes mellitus (HCC)     Past Surgical History:  Procedure Laterality Date   EYE SURGERY      Social History   Socioeconomic History   Marital status: Single    Spouse name: Not on file   Number of children: Not on file   Years of  education: Not on file   Highest education level: Not on file  Occupational History   Not on file  Tobacco Use   Smoking status: Never   Smokeless tobacco: Never  Vaping Use   Vaping status: Never Used  Substance and Sexual Activity   Alcohol use: No   Drug use: Never   Sexual activity: Not Currently  Other Topics Concern   Not on file  Social History Narrative   Not on file   Social Drivers of Health   Financial Resource Strain: Not on file  Food Insecurity: Not on file  Transportation Needs: Not on file  Physical Activity: Not on file  Stress: Not on file  Social Connections: Unknown (08/18/2022)   Received from [facility]   Social Network    Social Network: Not on file  Intimate Partner Violence: Not At Risk (08/18/2022)   Received from [facility]   HITS    Over the last 12 months how often did your partner physically hurt you?: Never    Over the last 12 months how often did your partner insult you or talk down to you?: Never    Over the last 12 months how often did your partner threaten you with physical harm?: Never    Over the last 12 months how often did your partner scream or curse at you?: Never    Current Outpatient Medications on File Prior to Visit  Medication Sig Dispense Refill   Continuous Blood Gluc Sensor (FREESTYLE LIBRE 3 SENSOR) MISC Use 1 sensor every 14 days. 6 each 2   Continuous Glucose Sensor (FREESTYLE LIBRE 3 PLUS SENSOR) MISC Inject 1 Device into the skin continuous. Change every 15 days 6 each 3   fluticasone  (FLONASE ) 50 MCG/ACT nasal spray Place 1 spray into both nostrils at bedtime. 16 g 1   Glucagon  (BAQSIMI  ONE PACK) 3 MG/DOSE POWD Use 1x as needed for hypoglycemia 1 each 11   glucose blood test strip 1 each by Other route as needed for other. Use as instructed     insulin  glargine (LANTUS  SOLOSTAR) 100 UNIT/ML Solostar Pen Inject 25-28 Units into the skin daily. 30 mL 3   insulin  lispro (HUMALOG ) 100  UNIT/ML injection INJECT 5-12 UNITS INTO THE SKIN THREE TIMES DAILY BEFORE MEALS 40 mL 3   tamsulosin (FLOMAX) 0.4 MG CAPS capsule Take 0.4 mg by mouth daily.     No current facility-administered medications on file prior to visit.    No Known Allergies  No family history on file.  PE: BP 110/64   Pulse 100   Ht 5\' 9"  (1.753  m)   Wt 171 lb 3.2 oz (77.7 kg)   SpO2 96%   BMI 25.28 kg/m  Wt Readings from Last 3 Encounters:  05/09/23 171 lb 3.2 oz (77.7 kg)  01/17/23 173 lb (78.5 kg)  11/04/22 178 lb (80.7 kg)   Constitutional: normal weight, in NAD Eyes: EOMI, no exophthalmos ENT: no neck masses palpated, no cervical lymphadenopathy Cardiovascular: tachycardia, RR, No MRG Respiratory: CTA B Musculoskeletal: no deformities Skin: no rashes Neurological: no tremor with outstretched hands  ASSESSMENT: 1. DM1, uncontrolled, with complications - PN - Diabetic foot ulcer - DR - History of seizures  2.  Overweight  3. H/o lack of insurance  PLAN:  1. Patient with longstanding, controlled, type 1 diabetes, on basal/bolus insulin  regimen, previously with NPH and Humalog , but currently with Lantus  and Humalog  after he got insurance (Medicaid).  At last visit, sugars were fluctuating within the target range but increasing significantly overnight, peaking around 6-7 AM.  He also had abrupt hyperglycemic spikes especially around midday and in the afternoon.  Upon questioning, he felt that these were due to high glycemic index meals.  He was using Humalog  more consistently with lunch, 2 to 3 units.  I advised him to increase the dose slightly.  Upon questioning, when the sugars were at goal or slightly low, down to 100 at night, he was eating a snack and we discussed that this is likely the reason for his blood sugars being elevated during the night.  I advised him to stop the snack.  We also discussed about switching from NPH to Lantus  and also that we may need to split the dose in the  future.  I sent a prescription to his pharmacy for the freestyle libre 3+ as the libre 3 CGM was on backorder. CGM Interpretation: -At today's visit, we reviewed his CGM downloads: It appears that 60% of values are in target range (goal >70%), while 38% are higher than 180 (goal <25%), and 2% are lower than 70 (goal <4%).  The calculated average blood sugar is 167.  The projected HbA1c for the next 3 months ([facility]) is 7.3%. -Reviewing the CGM trends, sugars remained fluctuating, mostly around the upper limit of the target range, but better controlled around 9 AM, 12 PM, and 9 PM.  He does mention occasional low blood sugars at night, although these are not obvious reviewing the 2-week CGM AGP report.  She still has significant hyperglycemic peaks followed by low blood sugars, more consistent with may be delaying the dose of Humalog .  I again advised him to try to take it 15 minutes before each meal.  I did suggest to maybe try Lyumjev  but for now he would like to stay with Humalog .  We discussed about splitting his Lantus  dose into 2 doses to see if this yields less variability in his blood sugars. -We again discussed about the possibility of an insulin  pump but he is reticent to start this as he would not want a device attached to him.  He does, however, like the CGM and he pays for it out-of-pocket.  He does mention that if the blood sugars do not improve over the next 2 visits, he will consider an insulin  pump. -I suggested to: Patient Instructions  Please split: - Lantus  13 units 2x a day  Please use: - Humalog  - 15 min before meals: Before B: 6-7 units Before L: 2-4 units Before D: 8-9 units  - Humalog  sliding scale: 150-200: + 0.5 unit 201-250: +  1 units 251-300: + 2 units > 300: + 3 units  Please return in 4 months.  - we checked his HbA1c: 7.4% (higher) - advised to check sugars at different times of the day - 4x a day, rotating check times - advised for yearly eye exams >> he is  UTD - return to clinic in 3-4 months  2.  Overweight -He actually lost 7 pounds since 6 months ago.  3. H/o Lack of insurance -Resolved -He was previously on NPH but at last visit, since he got Medicaid, we switched to an analog insulin  (Lantus ) -Was previously interested in an insulin  pump but he could not afford it.  At last visit, we discussed about this again but he would like to hold off for now  Emilie Harden, MD PhD [facility] Endocrinology

## 2023-05-09 NOTE — Patient Instructions (Addendum)
 Please split: - Lantus  13 units 2x a day  Please use: - Humalog  - 15 min before meals: Before B: 6-7 units Before L: 2-4 units Before D: 8-9 units  - Humalog  sliding scale: 150-200: + 0.5 unit 201-250: + 1 units 251-300: + 2 units > 300: + 3 units  Please return in 4 months.

## 2023-09-19 ENCOUNTER — Encounter: Payer: Self-pay | Admitting: Internal Medicine

## 2023-09-19 ENCOUNTER — Ambulatory Visit (INDEPENDENT_AMBULATORY_CARE_PROVIDER_SITE_OTHER): Payer: Medicaid Other | Admitting: Internal Medicine

## 2023-09-19 VITALS — BP 120/70 | HR 89 | Ht 69.0 in | Wt 168.8 lb

## 2023-09-19 DIAGNOSIS — E663 Overweight: Secondary | ICD-10-CM | POA: Diagnosis not present

## 2023-09-19 DIAGNOSIS — E1042 Type 1 diabetes mellitus with diabetic polyneuropathy: Secondary | ICD-10-CM

## 2023-09-19 LAB — POCT GLYCOSYLATED HEMOGLOBIN (HGB A1C): Hemoglobin A1C: 6.9 % — AB (ref 4.0–5.6)

## 2023-09-19 MED ORDER — LYUMJEV 100 UNIT/ML IJ SOLN: INTRAMUSCULAR | 11 refills | Status: DC

## 2023-09-19 NOTE — Progress Notes (Signed)
 Patient ID: [name], male   DOB: January 18, 1986, 38 y.o.   MRN: [id number]   HPI: Jonathan Armstrong is a 38 y.o.-year-old male, initially referred by his PCP, Jonathan Dorothyann BIRCH, MD , returning for f/u for DM1, dx 67 (38 y/o), uncontrolled, with complications (peripheral neuropathy, diabetic foot ulcer, diabetic retinopathy, history of seizure x1 2/2 hypoglycemia). He moved to [place] in 2017 from [place], [place]. Last OV 4 months ago.  Interim history: No blurry vision, nausea, chest pain with urination.  Reviewed history: He previously did not have insurance so he cannot afford long-acting insulin  analogs, a CGM, or an insulin  pump.  He got Medicaid in 2024.  Reviewed HbA1c levels: Lab Results  Component Value Date   HGBA1C 7.4 (A) 05/09/2023   HGBA1C 7.2 (A) 01/17/2023   HGBA1C 7.0 (A) 09/16/2022   HGBA1C 6.8 (A) 05/24/2022   HGBA1C 6.7 (A) 01/11/2022   HGBA1C 8.4 (A) 08/31/2021   HGBA1C 8.1 (A) 05/18/2021   HGBA1C 8.2 (A) 01/05/2021   HGBA1C 7.3 (A) 09/01/2020   HGBA1C 8.5 (A) 04/28/2020   HGBA1C 8.5 (A) 12/17/2019   HGBA1C 8.4 (A) 08/13/2019   HGBA1C 8.6 (A) 04/16/2019   HGBA1C 8.5 (A) 12/11/2018   HGBA1C 8.8 (A) 07/13/2018   HGBA1C 8.0 02/14/2017   HGBA1C 8.1 04/02/2016   Pt was on - ReliOn insulins: -NPH 15 units twice a day (tried to move second NPH dose at BT >> sugars higher in am) -Regular insulin /Humalog  12-15 units twice a day with B and D as he drops sugars if takes this with L.  Now on: - NPH 9 >> 8 units in am and 22 >> 20 >> 17-22 units at night >> Lantus  25 >> 26 units at night >> 13 units 2x a day >> no difference >> 26 units at night - Humalog : Before B:  6-7 >> 7-8 units Before L: 0 >> 2-4 >> 2-3 units Before D: 8-9 (10-11) units (may need to split the bolus into 70% before the meal and 30% 2h after the meal - or can try regular insulin ) - Humalog  SSI: 150-200: + 0.5 unit 201-250: + 1 units 251-300: + 2 units > 300: + 3 units Meter: AccuCheck  He  checks his blood sugars with the [employer] CGM:  Previously:  Prev.:   He does have lows.  Lowest sugar was 17 (!) >> seizure 2018 ... >> 32 x1 ... >> 46 >> 40s x1; he has hypoglycemia awareness in the 60s.   No previous hypoglycemia admission.   He wanted to pay out-of-pocket for Baqsimi .  I sent the prescription to his pharmacy.  I also gave him a coupon card. Highest sugar was 670 long time ago; more recently: 400 >> 320 >> 300 >> 350 >> .  No previous DKA admissions.    Pt's meals are: - Breakfast: 2/3 bowl oatmeal, butter, salt  - Lunch: PBJ sandwich or a bowl of noodles (0 units) - Dinner: meat + veggies+ starch (45-60 CHO) - Snacks: crackers She saw nutrition 2006-2008.  -No CKD, last BUN/creatinine:  Lab Results  Component Value Date   BUN 12 09/16/2022   BUN 12 08/31/2021   CREATININE 0.98 09/16/2022   CREATININE 0.90 08/31/2021   Lab Results  Component Value Date   MICRALBCREAT 1.5 09/16/2022   MICRALBCREAT 1.6 08/31/2021   MICRALBCREAT <30 02/14/2017  He had urinary tract infections after his diabetes diagnosis, but these resolved.  -No HL; last set of lipids: Lab Results  Component  Value Date   CHOL 179 09/16/2022   HDL 58.90 09/16/2022   LDLCALC 101 (H) 09/16/2022   TRIG 94.0 09/16/2022   CHOLHDL 3 09/16/2022    - last eye exam was in 2025: + DR reportedly.  He gets intraocular injections.  Dr. Onesimo Armstrong (who also his boss).  He had surgery for removal of retinal scarring 2018.  -+ Numbness in feet. Last foot exam: 09/16/2022.  Latest TSH was reviewed: Lab Results  Component Value Date   TSH 1.72 09/16/2022   He has a history of kidney stones.  He had cystoscopy 09/2022.  Pt has FH of DM1 in Paternal Great uncle. MGF and MGM had DM2. He would not want to have children to avoid the risk of getting type 1 diabetes.  ROS: + See HPI  I reviewed pt's medications, allergies, PMH, social hx, family hx, and changes were documented in the  history of present illness. Otherwise, unchanged from my initial visit note. Past Medical History:  Diagnosis Date   Chronic cough    Diabetic retinopathy associated with type 1 diabetes mellitus (HCC) 02/14/2017   Seizure (HCC)    Type 1 diabetes mellitus (HCC)     Past Surgical History:  Procedure Laterality Date   EYE SURGERY      Social History   Socioeconomic History   Marital status: Single    Spouse name: Not on file   Number of children: Not on file   Years of education: Not on file   Highest education level: Not on file  Occupational History   Not on file  Tobacco Use   Smoking status: Never   Smokeless tobacco: Never  Vaping Use   Vaping status: Never Used  Substance and Sexual Activity   Alcohol use: No   Drug use: Never   Sexual activity: Not Currently  Other Topics Concern   Not on file  Social History Narrative   Not on file   Social Drivers of Health   Financial Resource Strain: Not on file  Food Insecurity: Not on file  Transportation Needs: Not on file  Physical Activity: Not on file  Stress: Not on file  Social Connections: Unknown (08/18/2022)   Received from [facility]   Social Network    Social Network: Not on file  Intimate Partner Violence: Not At Risk (08/18/2022)   Received from [facility]   HITS    Over the last 12 months how often did your partner physically hurt you?: Never    Over the last 12 months how often did your partner insult you or talk down to you?: Never    Over the last 12 months how often did your partner threaten you with physical harm?: Never    Over the last 12 months how often did your partner scream or curse at you?: Never    Current Outpatient Medications on File Prior to Visit  Medication Sig Dispense Refill   Continuous Glucose Sensor (FREESTYLE LIBRE 3 PLUS SENSOR) MISC Inject 1 Device into the skin continuous. Change every 15 days 6 each 3   fluticasone  (FLONASE ) 50 MCG/ACT nasal spray Place 1 spray  into both nostrils at bedtime. 16 g 1   Glucagon  (BAQSIMI  ONE PACK) 3 MG/DOSE POWD Use 1x as needed for hypoglycemia 1 each 11   glucose blood test strip 1 each by Other route as needed for other. Use as instructed     insulin  glargine (LANTUS  SOLOSTAR) 100 UNIT/ML Solostar Pen Inject 25-28 Units  into the skin daily. 30 mL 3   insulin  lispro (HUMALOG ) 100 UNIT/ML injection INJECT 5-12 UNITS INTO THE SKIN THREE TIMES DAILY BEFORE MEALS 40 mL 3   tamsulosin (FLOMAX) 0.4 MG CAPS capsule Take 0.4 mg by mouth daily.     No current facility-administered medications on file prior to visit.    No Known Allergies  No family history on file.  PE: BP 120/70   Pulse 89   Ht 5' 9 (1.753 m)   Wt 168 lb 12.8 oz (76.6 kg)   SpO2 97%   BMI 24.93 kg/m  Wt Readings from Last 10 Encounters:  09/19/23 168 lb 12.8 oz (76.6 kg)  05/09/23 171 lb 3.2 oz (77.7 kg)  01/17/23 173 lb (78.5 kg)  11/04/22 178 lb (80.7 kg)  09/16/22 171 lb 3.2 oz (77.7 kg)  05/24/22 172 lb 9.6 oz (78.3 kg)  01/11/22 167 lb 9.6 oz (76 kg)  08/31/21 171 lb 9.6 oz (77.8 kg)  05/18/21 173 lb 3.2 oz (78.6 kg)  01/05/21 168 lb 9.6 oz (76.5 kg)   Constitutional: normal weight, in NAD Eyes: EOMI, no exophthalmos ENT: no neck masses palpated, no cervical lymphadenopathy Cardiovascular: RRR, No MRG Respiratory: CTA B Musculoskeletal: no deformities Skin: no rashes Neurological: no tremor with outstretched hands Diabetic Foot Exam - Simple   Simple Foot Form Diabetic Foot exam was performed with the following findings: Yes 09/19/2023  8:23 AM  Visual Inspection No deformities, no ulcerations, no other skin breakdown bilaterally: Yes Sensation Testing See comments: Yes Pulse Check Posterior Tibialis and Dorsalis pulse intact bilaterally: Yes Comments Decreased sensation to monofilament B    ASSESSMENT: 1. DM1, uncontrolled, with complications - PN - Diabetic foot ulcer - DR - History of seizures  2.   Overweight  3. H/o lack of insurance  PLAN:  1. Patient with longstanding, uncontrolled, type 1 diabetes, on basal-bolus insulin  regimen previously on NPH but now on Lantus  and also Humalog  after he got insurance (Medicaid).  At last visit, sugars remained fluctuating, mostly around the upper limit of the target range but better controlled around 9 AM, 12 PM, and 9 PM.  He did have occasional low blood sugars at night and significant hyperglycemic peaks followed by low blood sugars, more consistent with delaying the dose of Humalog .  I advised him to try his best to take the insulin  15 minutes before each meal and I also advised him to split the Lantus  into 2 doses to see if this helps with his blood sugars variability.  He pays for the CGM out-of-pocket.  He was reticent to start an insulin  pump as he did not want another device attached to him.  However, at last visit, he mentioned that he would consider a pump if the sugars did not improve. CGM Interpretation: -At today's visit, we reviewed his CGM downloads: It appears that 59% of values are in target range (goal >70%), while 38% are higher than 180 (goal <25%), and 3% are lower than 70 (goal <4%).  The calculated average blood sugar is 168.  The projected HbA1c for the next 3 months (GMI) is 7.3%. -Reviewing the CGM trends, sugars are very fluctuating, mostly elevated after lunch and particularly after dinner. He also has lows throughout the day and night, usually after a hyperglycemic peak.  Upon questioning, he feels that some of the peaks are caused by high glycemic index foods, and some of the lows are related to him over correcting high blood sugars.  He feels that he is mostly over correcting high blood sugars in the evening.  We discussed that he may need a higher dose of insulin  before dinner.  I advised him to add 1 unit.  However, during the day, in an effort to prevent his hyperglycemic peaks, other than improving diet, I suggested to switch  from Humalog  to Lyumjev .  This is injected at the start of the meal and acts quicker than Humalog .  He agrees to try this.  He prefers vials.  He may need a preauthorization but the medication appears to be tier 1 for his insurance. - He tried the split dose of Lantus  but this did not make a big difference so he is back to taking it once a day.  Will continue this. -I suggested to: Patient Instructions  Please continue: - Lantus  26 min at night - Humalog  - 15 min before meals: Before B: 7-8 units Before L: 2-3 units Before D: 8-11 units  - Humalog  sliding scale: 150-200: + 0.5 unit 201-250: + 1 units 251-300: + 2 units > 300: + 3 units  Try to add 1 unit of rapid acting insulin  for dinner.  Try Lyumjev .  Please return in 4 months.  - we checked his HbA1c: 6.9% (lower) - advised to check sugars at different times of the day - 4x a day, rotating check times - advised for yearly eye exams >> he is UTD -advised him to send me his latest eye exam report - will check annual labs today - return to clinic in 3-4 months  2.  Overweight - He lost 7 pounds before last visit - he lost 3 pounds since then  3. H/o Lack of insurance - Resolved -He was previously on NPH but, since he got Medicaid, we switched to an analog insulin  (Lantus ) -Was previously interested in an insulin  pump but he could not afford it.  He now has insurance, but would want to hold off starting a pump for now  Lela Fendt, MD PhD [facility] Endocrinology

## 2023-09-19 NOTE — Addendum Note (Signed)
 Addended by: CLEOTILDE ROLIN RAMAN on: 09/19/2023 08:57 AM   Modules accepted: Orders

## 2023-09-19 NOTE — Patient Instructions (Addendum)
 Please continue: - Lantus  26 min at night - Humalog  - 15 min before meals: Before B: 7-8 units Before L: 2-3 units Before D: 8-11 units  - Humalog  sliding scale: 150-200: + 0.5 unit 201-250: + 1 units 251-300: + 2 units > 300: + 3 units  Try to add 1 unit of rapid acting insulin  for dinner.  Try Lyumjev .  Please return in 4 months.

## 2023-09-20 ENCOUNTER — Ambulatory Visit: Payer: Self-pay | Admitting: Internal Medicine

## 2023-09-20 LAB — COMPREHENSIVE METABOLIC PANEL WITH GFR
AG Ratio: 1.6 (calc) (ref 1.0–2.5)
ALT: 56 U/L — ABNORMAL HIGH (ref 9–46)
AST: 25 U/L (ref 10–40)
Albumin: 4.4 g/dL (ref 3.6–5.1)
Alkaline phosphatase (APISO): 75 U/L (ref 36–130)
BUN: 13 mg/dL (ref 7–25)
CO2: 34 mmol/L — ABNORMAL HIGH (ref 20–32)
Calcium: 9.3 mg/dL (ref 8.6–10.3)
Chloride: 102 mmol/L (ref 98–110)
Creat: 0.87 mg/dL (ref 0.60–1.26)
Globulin: 2.7 g/dL (ref 1.9–3.7)
Glucose, Bld: 168 mg/dL — ABNORMAL HIGH (ref 65–99)
Potassium: 4.8 mmol/L (ref 3.5–5.3)
Sodium: 140 mmol/L (ref 135–146)
Total Bilirubin: 0.6 mg/dL (ref 0.2–1.2)
Total Protein: 7.1 g/dL (ref 6.1–8.1)
eGFR: 113 mL/min/{1.73_m2} (ref >=60)

## 2023-09-20 LAB — LIPID PANEL W/REFLEX DIRECT LDL
Cholesterol: 175 mg/dL (ref <200)
HDL: 65 mg/dL (ref >=40)
LDL Cholesterol (Calc): 87 mg/dL
Non-HDL Cholesterol (Calc): 110 mg/dL (ref <130)
Total CHOL/HDL Ratio: 2.7 (calc) (ref <5.0)
Triglycerides: 132 mg/dL (ref <150)

## 2023-09-20 LAB — TSH: TSH: 1.54 m[IU]/L (ref 0.40–4.50)

## 2023-09-21 ENCOUNTER — Other Ambulatory Visit

## 2023-09-22 LAB — MICROALBUMIN / CREATININE URINE RATIO
Creatinine, Urine: 49 mg/dL (ref 20–320)
Microalb, Ur: <0.2 mg/dL

## 2024-01-23 ENCOUNTER — Encounter: Payer: Self-pay | Admitting: Internal Medicine

## 2024-01-23 ENCOUNTER — Ambulatory Visit: Admitting: Internal Medicine

## 2024-01-23 VITALS — BP 130/60 | HR 88 | Ht 69.0 in | Wt 175.2 lb

## 2024-01-23 DIAGNOSIS — E1042 Type 1 diabetes mellitus with diabetic polyneuropathy: Secondary | ICD-10-CM

## 2024-01-23 DIAGNOSIS — E663 Overweight: Secondary | ICD-10-CM

## 2024-01-23 LAB — POCT GLYCOSYLATED HEMOGLOBIN (HGB A1C): Hemoglobin A1C: 6.8 % — AB (ref 4.0–5.6)

## 2024-01-23 MED ORDER — LYUMJEV 100 UNIT/ML IJ SOLN: INTRAMUSCULAR | 11 refills | Status: AC

## 2024-01-23 NOTE — Patient Instructions (Addendum)
 Please continue: - Lantus  26 units at night - Humalog  - 15 min before meals: Before B: 7-8 units Before L: 1-2 units Before D: 7-8 units - Humalog  sliding scale: 150-200: + 0.5 unit 201-250: + 1 units 251-300: + 2 units > 300: + 3 units  Try Lyumjev .  Look up: - Omnipod 5 - Tamdem t:slim and mobi pumps (mobi is also tubeless now) - twiist - iLET (no carb entries) - Medtronic 780G  Please return in 4 months.

## 2024-01-23 NOTE — Addendum Note (Signed)
 Addended by: CLEOTILDE ROLIN RAMAN on: 01/23/2024 04:44 PM   Modules accepted: Orders

## 2024-01-23 NOTE — Progress Notes (Signed)
 Patient ID: Jonathan Armstrong, male   DOB: June 29, 1985, 38 y.o.   MRN: [id number]   HPI: [name] is a 38 y.o.-year-old male, initially referred by his PCP, Alvan Dorothyann BIRCH, MD , returning for f/u for DM1, dx 35 (38 y/o), uncontrolled, with complications (peripheral neuropathy, diabetic foot ulcer, diabetic retinopathy, history of seizure x1 2/2 hypoglycemia). He moved to [place] in 2017 from [place], [place]. Last OV 4 months ago.  Interim history: No blurry vision, nausea, chest pain, increased urination.  Reviewed HbA1c levels: Lab Results  Component Value Date   HGBA1C 6.9 (A) 09/19/2023   HGBA1C 7.4 (A) 05/09/2023   HGBA1C 7.2 (A) 01/17/2023   HGBA1C 7.0 (A) 09/16/2022   HGBA1C 6.8 (A) 05/24/2022   HGBA1C 6.7 (A) 01/11/2022   HGBA1C 8.4 (A) 08/31/2021   HGBA1C 8.1 (A) 05/18/2021   HGBA1C 8.2 (A) 01/05/2021   HGBA1C 7.3 (A) 09/01/2020   HGBA1C 8.5 (A) 04/28/2020   HGBA1C 8.5 (A) 12/17/2019   HGBA1C 8.4 (A) 08/13/2019   HGBA1C 8.6 (A) 04/16/2019   HGBA1C 8.5 (A) 12/11/2018   HGBA1C 8.8 (A) 07/13/2018   HGBA1C 8.0 02/14/2017   HGBA1C 8.1 04/02/2016   Pt was on - ReliOn insulins: -NPH 15 units twice a day (tried to move second NPH dose at BT >> sugars higher in am) -Regular insulin /Humalog  12-15 units twice a day with B and D as he drops sugars if takes this with L.  Now on: - NPH 9 >> 8 units in am and 22 >> 20 >> 17-22 units at night >> Lantus  25 >> 26 units at night >> 13 units 2x a day >> no difference >> 26 units at night - Humalog : (Did not start Lyumjev  as he did not get this from the pharmacy) Before B:  6-7 >> 7-8 units Before L: 0 >> 2-4 >> 2-3 units Before D: 8-11 units (may need to split the bolus into 70% before the meal and 30% 2h after the meal - or can try regular insulin ) >> added 1 unit of rapid acting insulin  before the meal - Humalog  SSI: 150-200: + 0.5 unit 201-250: + 1 units 251-300: + 2 units > 300: + 3 units Meter: AccuCheck  He checks his  blood sugars with the [employer] CGM:  Prev.:  Previously:   He does have lows.  Lowest sugar was 17 (!) >> seizure 2018 ... >> 32 x1 ... >> 46 >> 40s x1; he has hypoglycemia awareness in the 60s.   No previous hypoglycemia admission.   He wanted to pay out-of-pocket for Baqsimi .  I sent the prescription to his pharmacy.  I also gave him a coupon card. Highest sugar was 670 long time ago; more recently: 400 >> .SABRA. 350 .  No previous DKA admissions.    Pt's meals are: - Breakfast: 2/3 bowl oatmeal, butter, salt  - Lunch: PBJ sandwich or a bowl of noodles (0 units) - Dinner: meat + veggies+ starch (45-60 CHO) - Snacks: crackers She saw nutrition 2006-2008.  -No CKD, last BUN/creatinine:  Lab Results  Component Value Date   BUN 13 09/19/2023   BUN 12 09/16/2022   CREATININE 0.87 09/19/2023   CREATININE 0.98 09/16/2022   Lab Results  Component Value Date   MICRALBCREAT NOTE 09/21/2023   MICRALBCREAT <30 02/14/2017   -No HL; last set of lipids: Lab Results  Component Value Date   CHOL 175 09/19/2023   HDL 65 09/19/2023   LDLCALC 87 09/19/2023   TRIG  132 09/19/2023   CHOLHDL 2.7 09/19/2023    - last eye exam was in 2025: + DR reportedly.  He gets intraocular injections.  Dr. Onesimo Blanch (who also his boss).  He had surgery for removal of retinal scarring 2018.  -+ Numbness in feet. Last foot exam: 09/19/2023.  Latest TSH was reviewed: Lab Results  Component Value Date   TSH 1.54 09/19/2023   He has a history of kidney stones.  He had cystoscopy 09/2022.  Pt has FH of DM1 in Paternal Great uncle. MGF and MGM had DM2. He would not want to have children to avoid the risk of getting type 1 diabetes.  ROS: + See HPI  I reviewed pt's medications, allergies, PMH, social hx, family hx, and changes were documented in the history of present illness. Otherwise, unchanged from my initial visit note. Past Medical History:  Diagnosis Date   Chronic cough    Diabetic  retinopathy associated with type 1 diabetes mellitus (HCC) 02/14/2017   Seizure (HCC)    Type 1 diabetes mellitus (HCC)     Past Surgical History:  Procedure Laterality Date   EYE SURGERY      Social History   Socioeconomic History   Marital status: Single    Spouse name: Not on file   Number of children: Not on file   Years of education: Not on file   Highest education level: Not on file  Occupational History   Not on file  Tobacco Use   Smoking status: Never   Smokeless tobacco: Never  Vaping Use   Vaping status: Never Used  Substance and Sexual Activity   Alcohol use: No   Drug use: Never   Sexual activity: Not Currently  Other Topics Concern   Not on file  Social History Narrative   Not on file   Social Drivers of Health   Financial Resource Strain: Not on file  Food Insecurity: Not on file  Transportation Needs: Not on file  Physical Activity: Not on file  Stress: Not on file  Social Connections: Unknown (08/18/2022)   Received from [facility]   Social Network    Social Network: Not on file  Intimate Partner Violence: Not At Risk (08/18/2022)   Received from [facility]   HITS    Over the last 12 months how often did your partner physically hurt you?: Never    Over the last 12 months how often did your partner insult you or talk down to you?: Never    Over the last 12 months how often did your partner threaten you with physical harm?: Never    Over the last 12 months how often did your partner scream or curse at you?: Never    Current Outpatient Medications on File Prior to Visit  Medication Sig Dispense Refill   Continuous Glucose Sensor (FREESTYLE LIBRE 3 PLUS SENSOR) MISC Inject 1 Device into the skin continuous. Change every 15 days 6 each 3   fluticasone  (FLONASE ) 50 MCG/ACT nasal spray Place 1 spray into both nostrils at bedtime. 16 g 1   Glucagon  (BAQSIMI  ONE PACK) 3 MG/DOSE POWD Use 1x as needed for hypoglycemia 1 each 11   glucose blood test  strip 1 each by Other route as needed for other. Use as instructed     insulin  glargine (LANTUS  SOLOSTAR) 100 UNIT/ML Solostar Pen Inject 25-28 Units into the skin daily. 30 mL 3   insulin  lispro (HUMALOG ) 100 UNIT/ML injection INJECT 5-12 UNITS INTO THE SKIN  THREE TIMES DAILY BEFORE MEALS 40 mL 3   Insulin  Lispro-aabc (LYUMJEV ) 100 UNIT/ML SOLN Inject under skin up to 30 units a day as discussed 10 mL 11   tamsulosin (FLOMAX) 0.4 MG CAPS capsule Take 0.4 mg by mouth daily.     No current facility-administered medications on file prior to visit.    No Known Allergies  No family history on file.  PE: BP 130/60   Pulse 88   Ht 5' 9 (1.753 m)   Wt 175 lb 3.2 oz (79.5 kg)   SpO2 96%   BMI 25.87 kg/m  Wt Readings from Last 10 Encounters:  01/23/24 175 lb 3.2 oz (79.5 kg)  09/19/23 168 lb 12.8 oz (76.6 kg)  05/09/23 171 lb 3.2 oz (77.7 kg)  01/17/23 173 lb (78.5 kg)  11/04/22 178 lb (80.7 kg)  09/16/22 171 lb 3.2 oz (77.7 kg)  05/24/22 172 lb 9.6 oz (78.3 kg)  01/11/22 167 lb 9.6 oz (76 kg)  08/31/21 171 lb 9.6 oz (77.8 kg)  05/18/21 173 lb 3.2 oz (78.6 kg)   Constitutional: normal weight, in NAD Eyes: EOMI, no exophthalmos ENT: no neck masses palpated, no cervical lymphadenopathy Cardiovascular: RRR, No MRG Respiratory: CTA B Musculoskeletal: no deformities Skin: no rashes Neurological: no tremor with outstretched hands  ASSESSMENT: 1. DM1, uncontrolled, with complications - PN - Diabetic foot ulcer - DR - History of seizures  2.  Overweight  3. H/o lack of insurance - He previously did not have insurance so he cannot afford long-acting insulin  analogs, a CGM, or an insulin  pump.  He got Medicaid in 2024. - He was previously on NPH but, since he got Medicaid, we switched to an analog insulin  (Lantus ) - He was previously interested in an insulin  pump but he could not afford it.  He now has insurance, but would want to hold off starting a pump for now  PLAN:  1.  Patient with longstanding, uncontrolled, type 1 diabetes, on basal-bolus insulin  regimen with Lantus  and Humalog , now after getting Medicaid insurance.  He was previously on NPH.  At last visits, he was paying for the CGM out-of-pocket and he was reticent to start an insulin  pump as he did not want another device attached to him.  At last visit, he mentions that he was considering an insulin  pump if needed.  At that time, reviewing the CGM trends, sugars were very fluctuating, mostly elevated after lunch and particularly after dinner.  He also had lows throughout the day and night, usually after hyperglycemic peaks.  Some of the peaks were caused by high glycemic index foods, he felt, and some of the lows were related to him over correcting high blood sugars.  I advised him to use a higher dose of insulin  before dinner as he felt that sugars are higher after this meal.  I also recommended to switch from Humalog  to Lyumjev , which can be injected at the start of the meal, rather than 15 minutes before with hopefully less blood sugar variability.  He tried to split the dose of Lantus  before last visit but this did not make a big difference so he switched back to taking it once a day.  I did not recommend to change this at that time. CGM Interpretation: -At today's visit, we reviewed his CGM downloads: It appears that 57% of values are in target range (goal >70%), while 42% are higher than 180 (goal <25%), and 1% are lower than 70 (goal <4%).  The  calculated average blood sugar is 169.  The projected HbA1c for the next 3 months (GMI) is 7.4%. -Reviewing the CGM trends, sugars appear to be fluctuating in the same manner as before, in a sawtooth pattern, and he mentions that this is due to alternating overcorrection for high blood sugars with overcorrection for low blood sugars.  We again discussed about trying not to overcorrect especially hypoglycemia, since this leads to increased blood sugars and the need to  correct these hyperglycemic peaks, leading to more hypoglycemia.  Upon questioning, he was not able to obtain Lyumjev , which I suspect may be beneficial for him since he can be injected at the start of the meal.  He is not sure whether his insurance covers it or not.  I called in at last visit to [facility], but he mentions that he is not using this pharmacy anymore.  At today's visit, I called into [facility]. -However, we also discussed about the possible complications from fluctuating high blood sugars, and I again suggested an insulin  pump.  He is still reticent for this but we discussed about different options and he would maybe be  interested in the tubeless version.  We did discuss about the OmniPod 5 (given brochure), but also the fact that this algorithm may not be the best for him.  We discussed about the MOBI tandem tubeless pump or the twiist pump but I also advised him to look up the t:slim X2, ilet, and the Medtronic pump.  We discussed how they differ.  He first wants to try Lyumjev  and see if this helps, but if not, he will look into the suggested pumps. -Reviewing his insulin  doses now, he is using lower doses with dinner, but due to the significant fluctuations throughout the day and night, I did not recommend a change in the insulin  amount for now. -I suggested to: Patient Instructions  Please continue: - Lantus  26 units at night - Humalog  - 15 min before meals: Before B: 7-8 units Before L: 1-2 units Before D: 7-8 units - Humalog  sliding scale: 150-200: + 0.5 unit 201-250: + 1 units 251-300: + 2 units > 300: + 3 units  Try Lyumjev .  Look up: - Omnipod 5 - Tamdem t:slim and mobi pumps (mobi is also tubeless now) - twiist - iLET (no carb entries) - Medtronic 780G  Please return in 4 months.  - we checked his HbA1c: 6.8% (slightly lower) - advised to check sugars at different times of the day - 4x a day, rotating check times - advised for yearly eye exams >> he is UTD -  return to clinic in 4 months  2.  Overweight - He lost 10 pounds before the last 2 visits combined - He gained 7 pounds back since last visit.  Lela Fendt, MD PhD [facility] Endocrinology

## 2024-03-14 ENCOUNTER — Other Ambulatory Visit: Payer: Self-pay

## 2024-03-14 DIAGNOSIS — E1042 Type 1 diabetes mellitus with diabetic polyneuropathy: Secondary | ICD-10-CM

## 2024-03-14 MED ORDER — LANTUS SOLOSTAR 100 UNIT/ML ~~LOC~~ SOPN: 25.0000 [IU] | PEN_INJECTOR | Freq: Every day | SUBCUTANEOUS | 3 refills | Status: DC

## 2024-03-21 ENCOUNTER — Encounter: Payer: Self-pay | Admitting: Internal Medicine

## 2024-03-21 DIAGNOSIS — E1042 Type 1 diabetes mellitus with diabetic polyneuropathy: Secondary | ICD-10-CM

## 2024-03-23 MED ORDER — LANTUS SOLOSTAR 100 UNIT/ML ~~LOC~~ SOPN: 25.0000 [IU] | PEN_INJECTOR | Freq: Every day | SUBCUTANEOUS | 3 refills | Status: AC

## 2024-05-28 ENCOUNTER — Ambulatory Visit: Admitting: Internal Medicine
